# Patient Record
Sex: Male | Born: 1970 | Race: White | Hispanic: No | Smoking: Former smoker
Health system: Southern US, Community
[De-identification: ages and names within clinical notes are randomized; demographics above are authoritative.]

## PROBLEM LIST (undated history)

## (undated) DIAGNOSIS — K219 Gastro-esophageal reflux disease without esophagitis: Secondary | ICD-10-CM

## (undated) DIAGNOSIS — F329 Major depressive disorder, single episode, unspecified: Secondary | ICD-10-CM

## (undated) DIAGNOSIS — N401 Enlarged prostate with lower urinary tract symptoms: Secondary | ICD-10-CM

## (undated) DIAGNOSIS — Z86010 Personal history of colonic polyps: Secondary | ICD-10-CM

## (undated) DIAGNOSIS — F32A Depression, unspecified: Secondary | ICD-10-CM

## (undated) DIAGNOSIS — Z87898 Personal history of other specified conditions: Secondary | ICD-10-CM

## (undated) DIAGNOSIS — K649 Unspecified hemorrhoids: Secondary | ICD-10-CM

## (undated) DIAGNOSIS — R339 Retention of urine, unspecified: Secondary | ICD-10-CM

## (undated) DIAGNOSIS — F419 Anxiety disorder, unspecified: Secondary | ICD-10-CM

## (undated) DIAGNOSIS — Z860101 Personal history of adenomatous and serrated colon polyps: Secondary | ICD-10-CM

## (undated) DIAGNOSIS — F411 Generalized anxiety disorder: Secondary | ICD-10-CM

## (undated) HISTORY — DX: Major depressive disorder, single episode, unspecified: F32.9

## (undated) HISTORY — PX: COLONOSCOPY: SHX174

## (undated) HISTORY — DX: Gastro-esophageal reflux disease without esophagitis: K21.9

## (undated) HISTORY — PX: CARPAL TUNNEL RELEASE: SHX101

## (undated) HISTORY — DX: Anxiety disorder, unspecified: F41.9

## (undated) HISTORY — DX: Depression, unspecified: F32.A

## (undated) HISTORY — PX: TONSILLECTOMY AND ADENOIDECTOMY: SHX28

---

## 2000-05-27 HISTORY — PX: CARPAL TUNNEL RELEASE: SHX101

## 2018-09-16 NOTE — Progress Notes (Signed)
Virtual Visit via Telephone Note  I connected with Tyron Russell on 09/17/18 at 11:15 AM EDT by telephone and verified that I am speaking with the correct person using two identifiers.   I discussed the limitations, risks, security and privacy concerns of performing an evaluation and management service by telephone and the availability of in person appointments. The staff discussed with the patient that there may be a patient responsible charge related to this service. The patient expressed understanding and agreed to proceed.  Location of Patient- Home Location of Provider- In Clinic   History of Present Illness: Mr. Aloia calls in today to establish as a new.  He is pleasant 48 year old male. He recently moved from [place] and needs local providers.  He reports having Psychiatry telemedicine appt over the weekend- great! He was initially dx'd with Depression/Anxiety in early teens. 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 He is newly engaged and moved her to be with his fiance  Since gyms are closed due to COVID-19 pandemic, he is not exercising He reports "pretty healthy eating" His BP was taken on wrist cuff this morning- advised to purchase cuff that fastens around upper arm His mother has HTN His father suffered at MI at  age 49- underwent PCI with stents and CABG He has never been on anti-hypertensives or statins He reports his labs from his CPE last year "were fine"  Patient Care Team    Relationship Specialty Notifications Start End  , Berna Spare, NP PCP - General Family Medicine  09/17/18     There are no active problems to display for this patient.    Past Medical History:  Diagnosis Date  . Anxiety   . Depression      Past Surgical History:  Procedure Laterality Date  . CARPAL TUNNEL RELEASE Right      Family History  Problem Relation Age of Onset  . Hypertension Mother   . Anxiety disorder Mother   . Depression Mother   . Heart attack Father   . Depression Maternal Grandmother   . Anxiety disorder Maternal Grandmother   . Hypertension Maternal Grandmother   . Alcohol abuse Maternal Grandfather      Social History   Substance and Sexual Activity  Drug Use Yes  . Frequency: 1.0 times per week  . Types: Marijuana     Social History   Substance and Sexual Activity  Alcohol Use Yes   Comment: occassionally     Social History   Tobacco Use  Smoking Status Former Smoker  . Packs/day: 0.50  . Years: 4.00  . Pack years: 2.00  . Types: Cigarettes  . Last attempt to quit: 05/28/1995  . Years since quitting: 23.3  Smokeless Tobacco Never Used     Outpatient Encounter Medications as of 09/17/2018  Medication Sig  . clonazePAM (KLONOPIN) 0.5 MG tablet Take  0.5 mg by mouth 2 (two) times daily as needed for anxiety.  Marland Kitchen FLUoxetine (PROZAC) 40 MG capsule Take 40 mg by mouth daily.  . risperiDONE (RISPERDAL) 2 MG tablet Take 2 mg by mouth at bedtime.   No facility-administered encounter medications on file as of 09/17/2018.     Allergies: Patient has no known allergies.  Body mass index is 34.59 kg/m.  Blood pressure (!) 170/118, pulse 89, temperature (!) 95.5 F (35.3 C), temperature source Oral, height 5\' 11"  (1.803 m), weight 248 lb (112.5 kg).     Observations/Objective: No acute distress noted during telephone conversation  Assessment and Plan: Procure upper arm BP cuff - check BP/HR daily- f/u one week Keep Psychiatry appt this weekend- they will manage mental health disorders and provide Rx Increase daily exercise Stop marijuana use   Follow Up Instructions: 1 week BP/HR CPE 3 months, fasting labs the week prior    I discussed the assessment and treatment plan with the patient. The patient was provided an opportunity to ask questions and all were answered. The patient agreed with the plan and demonstrated an understanding of the instructions.   The patient was advised to call back or seek an in-person evaluation if the symptoms worsen or if the condition fails to improve as anticipated.  I provided 30 minutes of non-face-to-face time during this encounter.   Esaw Grandchild, NP

## 2018-09-17 ENCOUNTER — Ambulatory Visit (INDEPENDENT_AMBULATORY_CARE_PROVIDER_SITE_OTHER): Payer: Medicare Other | Admitting: Adult Health

## 2018-09-17 ENCOUNTER — Encounter: Payer: Self-pay | Admitting: Adult Health

## 2018-09-17 ENCOUNTER — Other Ambulatory Visit: Payer: Self-pay

## 2018-09-17 VITALS — BP 170/118 | HR 89 | Temp 95.5°F | Ht 71.0 in | Wt 248.0 lb

## 2018-09-17 DIAGNOSIS — F32A Depression, unspecified: Secondary | ICD-10-CM

## 2018-09-17 DIAGNOSIS — Z0000 Encounter for general adult medical examination without abnormal findings: Secondary | ICD-10-CM | POA: Insufficient documentation

## 2018-09-17 DIAGNOSIS — F329 Major depressive disorder, single episode, unspecified: Secondary | ICD-10-CM | POA: Diagnosis not present

## 2018-09-17 DIAGNOSIS — F419 Anxiety disorder, unspecified: Secondary | ICD-10-CM

## 2018-09-17 NOTE — Assessment & Plan Note (Signed)
He was initially dx'd with Depression/Anxiety in early teens. He is currently taking Fluoxetine 40 mg QD, Risperidone 2mg  QD and Clonazepam 0.5mg  BID- he denies sedation or other SE He denies SI/HI He reports this medication regime for years He reports typically checking in with Behavioral Health biweekly He was placed on permanently disability 2018 for anxiety/panic attacks He previously worked as Designer, television/film set

## 2018-09-17 NOTE — Assessment & Plan Note (Signed)
Assessment and Plan: Procure upper arm BP cuff - check BP/HR daily- f/u one week Keep Psychiatry appt this weekend- they will manage mental health disorders and provide Rx Increase daily exercise Stop marijuana use   Follow Up Instructions: 1 week BP/HR CPE 3 months, fasting labs the week prior    I discussed the assessment and treatment plan with the patient. The patient was provided an opportunity to ask questions and all were answered. The patient agreed with the plan and demonstrated an understanding of the instructions.   The patient was advised to call back or seek an in-person evaluation if the symptoms worsen or if the condition fails to improve as anticipated.

## 2018-09-21 NOTE — Progress Notes (Addendum)
Virtual Visit via Telephone Note  I connected with Eric Hopkins on 09/23/18 at 10:30 AM EDT by telephone and verified that I am speaking with the correct person using two identifiers.   I discussed the limitations, risks, security and privacy concerns of performing an evaluation and management service by telephone and the availability of in person appointments.The staff discussed with the patient that there may be a patient responsible charge related to this service. The patient expressed understanding and agreed to proceed.  Location of Patient- Home Location of Provider- In Clinic   History of Present Illness: 09/17/2018 OV: Eric Hopkins calls in today to establish as a new.  He is pleasant 48 year old male. He recently moved from [place] and needs local providers.  He reports having Psychiatry telemedicine appt over the weekend- great! He was initially dx'd with Depression/Anxiety in early teens. He is currently taking Fluoxetine 40 mg QD, Risperidone 2mg  QD and Clonazepam 0.5mg  BID- he denies sedation or other SE He denies SI/HI He reports this medication regime for years He reports typically checking in with Behavioral Health biweekly He was placed on permanently disability 2018 for anxiety/panic attacks He previously worked as Designer, television/film set  He denies ETOH use He reports nightly marijuana use for insomnia/anxiety Advised him not to use marijuana, instead guided meditation, nightly stretching for stress relief and improve sleep He reports his wife committed suicide 20115, he has 40 year old son- still living in [place] and doing well (works in Architect and has strong local support system). He is newly engaged and moved her to be with his fiance  Since gyms are closed due to COVID-19 pandemic, he is not exercising He reports "pretty healthy eating" His BP was taken on wrist cuff this morning- advised to purchase cuff that fastens around upper arm His mother has HTN His father  suffered at MI at age 76- underwent PCI with stents and CABG He has never been on anti-hypertensives or statins He reports his labs from his CPE last year "were fine" 09/23/2018 OV: Eric Hopkins calls in today with home BP/HR readings: SBP 120-150s, means upper 120s DBP 80-100, mean mid 80s HR 70-80s Readings taken mostly in late afternoon, early evening  He denies CP/dyspnea/dizziness/HA/change in vision These readings have been taken from new cuff/around bicep He has been reducing "junk food", increasing fruits/vegetables and "never adds salt to my food". He has been walking and "playing with my dogs" 4-5 times/day- gym remains closed due to COVID-19 pandemic  He had initial appt with psychologist over weekend, will continue therapy bi-weekly.  Psychologist will refer him to a Nature conservation officer for med management He continues to abstain from ETOH, but continues to use marijuana nightly- again dicussed the potential negative interactions of marijuana and his rx's and encouraged him to stop use   Review of Systems: General:   No F/C, wt loss Pulm:   No DIB, SOB, pleuritic chest pain Card:  No CP, palpitations Abd:  No n/v/d or pain Ext:  No inc edema from baseline This patient does not have sx concerning for COVID-19 Infection (ie; fever, chills, cough, new or worsening shortness of breath).   Patient Care Team    Relationship Specialty Notifications Start End  Esaw Grandchild, NP PCP - General Family Medicine  09/17/18     Patient Active Problem List   Diagnosis Date Noted  . Anxiety and depression 09/17/2018  . Healthcare maintenance 09/17/2018     Past Medical History:  Diagnosis  Date  . Anxiety   . Depression      Past Surgical History:  Procedure Laterality Date  . CARPAL TUNNEL RELEASE Right      Family History  Problem Relation Age of Onset  . Hypertension Mother   . Anxiety disorder Mother   . Depression Mother   . Heart attack Father   . Depression  Maternal Grandmother   . Anxiety disorder Maternal Grandmother   . Hypertension Maternal Grandmother   . Alcohol abuse Maternal Grandfather      Social History   Substance and Sexual Activity  Drug Use Yes  . Frequency: 1.0 times per week  . Types: Marijuana     Social History   Substance and Sexual Activity  Alcohol Use Yes   Comment: occassionally     Social History   Tobacco Use  Smoking Status Former Smoker  . Packs/day: 0.50  . Years: 4.00  . Pack years: 2.00  . Types: Cigarettes  . Last attempt to quit: 05/28/1995  . Years since quitting: 23.3  Smokeless Tobacco Never Used     Outpatient Encounter Medications as of 09/23/2018  Medication Sig  . clonazePAM (KLONOPIN) 0.5 MG tablet Take 0.5 mg by mouth 2 (two) times daily as needed for anxiety.  Marland Kitchen FLUoxetine (PROZAC) 40 MG capsule Take 40 mg by mouth daily.  . risperiDONE (RISPERDAL) 2 MG tablet Take 2 mg by mouth at bedtime.   No facility-administered encounter medications on file as of 09/23/2018.     Allergies: Patient has no known allergies.  There is no height or weight on file to calculate BMI.  Blood pressure 123/86, pulse 84.  Observations/Objective: No acute distress noted during telephone conversation  Assessment and Plan: Continue all medications as directed. Remain well hydrated and follow Mediterranean Diet. Remain as active as possible. Continue with close f/u with psychology and ask for referral to local psychiatrist for med management. Continue to check BP/HR several times week at different times of the day. If you have 4 readings consistently >140/90, then call clinic Come fasting to CPE  OV in July  Follow Up Instructions: CPE with Fasting labs in July 2020   I discussed the assessment and treatment plan with the patient. The patient was provided an opportunity to ask questions and all were answered. The patient agreed with the plan and demonstrated an understanding of the  instructions.   The patient was advised to call back or seek an in-person evaluation if the symptoms worsen or if the condition fails to improve as anticipated.  I provided 15 minutes of non-face-to-face time during this encounter.   Esaw Grandchild, NP ;

## 2018-09-23 ENCOUNTER — Other Ambulatory Visit: Payer: Self-pay

## 2018-09-23 ENCOUNTER — Ambulatory Visit (INDEPENDENT_AMBULATORY_CARE_PROVIDER_SITE_OTHER): Payer: Medicare Other | Admitting: Adult Health

## 2018-09-23 ENCOUNTER — Encounter: Payer: Self-pay | Admitting: Adult Health

## 2018-09-23 VITALS — BP 123/86 | HR 84

## 2018-09-23 DIAGNOSIS — F419 Anxiety disorder, unspecified: Secondary | ICD-10-CM

## 2018-09-23 DIAGNOSIS — F329 Major depressive disorder, single episode, unspecified: Secondary | ICD-10-CM | POA: Diagnosis not present

## 2018-09-23 DIAGNOSIS — Z0000 Encounter for general adult medical examination without abnormal findings: Secondary | ICD-10-CM | POA: Diagnosis not present

## 2018-09-23 DIAGNOSIS — F32A Depression, unspecified: Secondary | ICD-10-CM

## 2018-09-23 NOTE — Assessment & Plan Note (Signed)
Continue with psychology as directed- biweekly therapy Request referral to local psychiatrist at next appt with psychologist. Pt reports stable mood

## 2018-09-23 NOTE — Assessment & Plan Note (Signed)
Assessment and Plan: Continue all medications as directed. Remain well hydrated and follow Mediterranean Diet. Remain as active as possible. Continue with close f/u with psychology and ask for referral to local psychiatrist for med management. Continue to check BP/HR several times week at different times of the day. If you have 4 readings consistently >140/90, then call clinic Come fasting to CPE  OV in July  Follow Up Instructions: CPE with Fasting labs in July 2020   I discussed the assessment and treatment plan with the patient. The patient was provided an opportunity to ask questions and all were answered. The patient agreed with the plan and demonstrated an understanding of the instructions.   The patient was advised to call back or seek an in-person evaluation if the symptoms worsen or if the condition fails to improve as anticipated.

## 2018-11-03 ENCOUNTER — Telehealth: Payer: Self-pay | Admitting: Adult Health

## 2018-11-03 NOTE — Telephone Encounter (Signed)
Patient called states Medicaid needs Dr. Hershal Coria NPI #---

## 2018-11-04 NOTE — Telephone Encounter (Signed)
LVM for pt with Dr. Hershal Coria NPI.  Charyl Bigger, CMA

## 2018-12-23 ENCOUNTER — Encounter: Payer: Medicare Other | Admitting: Adult Health

## 2019-02-18 ENCOUNTER — Other Ambulatory Visit: Payer: Medicare Other

## 2019-02-18 ENCOUNTER — Other Ambulatory Visit: Payer: Self-pay

## 2019-02-18 DIAGNOSIS — Z0000 Encounter for general adult medical examination without abnormal findings: Secondary | ICD-10-CM

## 2019-02-19 LAB — COMPREHENSIVE METABOLIC PANEL
ALT: 23 IU/L (ref 0–44)
AST: 20 IU/L (ref 0–40)
Albumin/Globulin Ratio: 1.9 (ref 1.2–2.2)
Albumin: 4.7 g/dL (ref 4.0–5.0)
Alkaline Phosphatase: 75 IU/L (ref 39–117)
BUN/Creatinine Ratio: 9 (ref 9–20)
BUN: 10 mg/dL (ref 6–24)
Bilirubin Total: 0.4 mg/dL (ref 0.0–1.2)
CO2: 24 mmol/L (ref 20–29)
Calcium: 9.3 mg/dL (ref 8.7–10.2)
Chloride: 101 mmol/L (ref 96–106)
Creatinine, Ser: 1.11 mg/dL (ref 0.76–1.27)
GFR calc Af Amer: 91 mL/min/{1.73_m2} (ref >59)
GFR calc non Af Amer: 79 mL/min/{1.73_m2} (ref >59)
Globulin, Total: 2.5 g/dL (ref 1.5–4.5)
Glucose: 103 mg/dL — ABNORMAL HIGH (ref 65–99)
Potassium: 4.4 mmol/L (ref 3.5–5.2)
Sodium: 137 mmol/L (ref 134–144)
Total Protein: 7.2 g/dL (ref 6.0–8.5)

## 2019-02-19 LAB — LIPID PANEL
Chol/HDL Ratio: 6.2 ratio — ABNORMAL HIGH (ref 0.0–5.0)
Cholesterol, Total: 174 mg/dL (ref 100–199)
HDL: 28 mg/dL — ABNORMAL LOW (ref >39)
LDL Chol Calc (NIH): 95 mg/dL (ref 0–99)
Triglycerides: 303 mg/dL — ABNORMAL HIGH (ref 0–149)
VLDL Cholesterol Cal: 51 mg/dL — ABNORMAL HIGH (ref 5–40)

## 2019-02-19 LAB — TSH: TSH: 3.41 u[IU]/mL (ref 0.450–4.500)

## 2019-02-25 ENCOUNTER — Ambulatory Visit (INDEPENDENT_AMBULATORY_CARE_PROVIDER_SITE_OTHER): Payer: Medicare Other | Admitting: Adult Health

## 2019-02-25 ENCOUNTER — Other Ambulatory Visit: Payer: Self-pay

## 2019-02-25 ENCOUNTER — Encounter: Payer: Self-pay | Admitting: Adult Health

## 2019-02-25 VITALS — BP 130/82 | HR 101 | Temp 98.5°F | Ht 68.5 in | Wt 241.2 lb

## 2019-02-25 DIAGNOSIS — Z0000 Encounter for general adult medical examination without abnormal findings: Secondary | ICD-10-CM

## 2019-02-25 DIAGNOSIS — Z23 Encounter for immunization: Secondary | ICD-10-CM

## 2019-02-25 DIAGNOSIS — E781 Pure hyperglyceridemia: Secondary | ICD-10-CM | POA: Diagnosis not present

## 2019-02-25 NOTE — Assessment & Plan Note (Signed)
The 10-year ASCVD risk score Mikey Bussing DC Brooke Bonito., et al., 2013) is: 4.1%   Values used to calculate the score:     Age: 48 years     Sex: Male     Is Non-Hispanic African American: No     Diabetic: No     Tobacco smoker: No     Systolic Blood Pressure: [id number]     Is BP treated: No     HDL Cholesterol: 28 mg/dL     Total Cholesterol: 174 mg/dL  LDL- 95 Lifestyle encouraged Recheck in 4 months

## 2019-02-25 NOTE — Assessment & Plan Note (Signed)
Remain well hydrated with water.   Follow Heart Healthy diet, focus on reducing your sugar/carbohydrate intake. Increase regular exercise.  Recommend at least 30 minutes daily, 5 days per week of walking, biking, swimming, YouTube/Pinterest workout videos. Triglycerides are elevated and HDL (good) cholesterol is low- again focus on diet/exercise to normalize. Please schedule follow- up in 4 months and come fasting to re-check labs (CMP and Lipid panel). Continue close follow-up with mental health care team. Continue to social distance and wear a mask when in public.

## 2019-02-25 NOTE — Progress Notes (Signed)
Subjective:    Patient ID: Eric Hopkins, male    DOB: April 08, 1971, 48 y.o.   MRN: [id number]  HPI:09/17/2018 OV: Eric Hopkins calls in today to establish as a new. He is pleasant 48 year old male. He recently moved from [place] and needs local providers. He reports having Psychiatry telemedicine appt over the weekend- great! He was initially dx'd with Depression/Anxiety in early teens. He is currently taking Fluoxetine 40 mg QD, Risperidone 2mg  QD and Clonazepam 0.5mg  BID- he denies sedation or other SE He denies SI/HI He reports this medication regime for years He reports typically checking in with Behavioral Health biweekly He was placed on permanently disability 2018 for anxiety/panic attacks He previously worked as Designer, television/film set  He denies ETOH use He reports nightly marijuana use for insomnia/anxiety Advised him not to use marijuana, instead guided meditation, nightly stretching for stress relief and improve sleep He reports his wife committed suicide [id number], he has 81 year old son- still living in [place] and doing well (works in Architect and has strong local support system). He is newly engaged and moved her to be with his fiance  Since gyms are closed due to COVID-19 pandemic, he is not exercising He reports "pretty healthy eating" His BP was taken on wrist cuff this morning- advised to purchase cuff that fastens around upper arm His mother has HTN His father suffered at MI at age 58- underwent PCI with stents and CABG He has never been on anti-hypertensives or statins He reports his labs from his CPE last year "were fine" 09/23/2018 OV: Eric Hopkins calls in today with home BP/HR readings: SBP 120-150s, means upper 120s DBP 80-100, mean mid 80s HR 70-80s Readings taken mostly in late afternoon, early evening  He denies CP/dyspnea/dizziness/HA/change in vision These readings have been taken from new cuff/around bicep He has been reducing "junk food", increasing  fruits/vegetables and "never adds salt to my food". He has been walking and "playing with my dogs" 4-5 times/day- gym remains closed due to COVID-19 pandemic  He had initial appt with psychologist over weekend, will continue therapy bi-weekly.  Psychologist will refer him to a local Psychiatrist for med management He continues to abstain from ETOH, but continues to use marijuana nightly- again dicussed the potential negative interactions of marijuana and his rx's and encouraged him to stop use  02/25/2019 OV:  Eric Hopkins is here for CPE He has been "running around with the dogs" and "tryinig to drum as much as I can".  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 He has lost 7 lbs in 5 months. Current weight 241 Body mass index is 36.14 kg/m. He has psychology appt's every 4 weeks, psychiatry appt's every 3 months He reports stable mood, denies SI/HI He has only used one dose of Clonazepam 0.5mg  in last month He reports smoking marijuana at least twice weekly, again encouraged him to stop.  His insurance will not cover A1c and CBC  02/18/2019 Labs: CMP-stable, slight elevation in BG  TSH-WNL, 3.410  The 10-year ASCVD risk score Eric Hopkins DC Jr., et al., 2013) is: 4.1%   Values used to calculate the score:     Age: 69 years     Sex: Male     Is Non-Hispanic African American: No     Diabetic: No     Tobacco smoker: No     Systolic Blood Pressure: [id number] mmHg     Is BP treated: No  HDL Cholesterol: 28 mg/dL     Total Cholesterol: 174 mg/dL LDL-95   Healthcare Maintenance: Immunizations:  Patient Care Team    Relationship Specialty Notifications Start End  Eric Grandchild, NP PCP - General Family Medicine  09/17/18     Patient Active Problem List   Diagnosis Date Noted  . Hypertriglyceridemia 02/25/2019  . Anxiety and depression 09/17/2018  . Healthcare maintenance 09/17/2018     Past Medical History:  Diagnosis Date  . Anxiety   . Depression       Past Surgical History:  Procedure Laterality Date  . CARPAL TUNNEL RELEASE Right      Family History  Problem Relation Age of Onset  . Hypertension Mother   . Anxiety disorder Mother   . Depression Mother   . Heart attack Father   . Depression Maternal Grandmother   . Anxiety disorder Maternal Grandmother   . Hypertension Maternal Grandmother   . Alcohol abuse Maternal Grandfather      Social History   Substance and Sexual Activity  Drug Use Yes  . Frequency: 1.0 times per week  . Types: Marijuana     Social History   Substance and Sexual Activity  Alcohol Use Yes   Comment: occassionally     Social History   Tobacco Use  Smoking Status Former Smoker  . Packs/day: 0.50  . Years: 4.00  . Pack years: 2.00  . Types: Cigarettes  . Quit date: 05/28/1995  . Years since quitting: 23.7  Smokeless Tobacco Never Used     Outpatient Encounter Medications as of 02/25/2019  Medication Sig  . clonazePAM (KLONOPIN) 0.5 MG tablet Take 0.5 mg by mouth 2 (two) times daily as needed for anxiety.  Marland Kitchen FLUoxetine (PROZAC) 40 MG capsule Take 40 mg by mouth daily.  . risperiDONE (RISPERDAL) 2 MG tablet Take 2 mg by mouth at bedtime.   No facility-administered encounter medications on file as of 02/25/2019.     Allergies: Patient has no known allergies.  Body mass index is 36.14 kg/m.  Blood pressure 130/82, pulse (!) 101, temperature 98.5 F (36.9 C), temperature source Oral, height 5' 8.5" (1.74 m), weight 241 lb 3.2 oz (109.4 kg), SpO2 96 %.  Review of Systems  Constitutional: Positive for fatigue. Negative for activity change, appetite change, chills, diaphoresis, fever and unexpected weight change.  HENT: Negative for congestion.   Eyes: Negative for visual disturbance.  Respiratory: Negative for cough, chest tightness, shortness of breath, wheezing and stridor.   Cardiovascular: Negative for chest pain, palpitations and leg swelling.  Gastrointestinal:  Negative for abdominal distention, anal bleeding, blood in stool, constipation, diarrhea, nausea and vomiting.  Endocrine: Negative for cold intolerance, heat intolerance, polydipsia, polyphagia and polyuria.  Genitourinary: Negative for decreased urine volume, difficulty urinating, flank pain, genital sores, hematuria and urgency.  Musculoskeletal: Negative for arthralgias, back pain, gait problem, joint swelling, myalgias, neck pain and neck stiffness.       R shoulder crepitus   Skin: Negative for color change, pallor, rash and wound.  Neurological: Negative for dizziness and headaches.  Hematological: Negative for adenopathy. Does not bruise/bleed easily.  Psychiatric/Behavioral: Negative for agitation, behavioral problems, confusion, decreased concentration, dysphoric mood, hallucinations, self-injury, sleep disturbance and suicidal ideas. The patient is not nervous/anxious and is not hyperactive.        Objective:   Physical Exam Vitals signs and nursing note reviewed.  Constitutional:      General: He is not in acute distress.    Appearance:  He is obese. He is not ill-appearing, toxic-appearing or diaphoretic.  HENT:     Head: Normocephalic and atraumatic.     Right Ear: Tympanic membrane, ear canal and external ear normal. There is no impacted cerumen.     Left Ear: Tympanic membrane, ear canal and external ear normal. There is no impacted cerumen.     Nose: Nose normal. No congestion.     Mouth/Throat:     Mouth: Mucous membranes are moist.     Pharynx: No oropharyngeal exudate.  Eyes:     Extraocular Movements: Extraocular movements intact.     Conjunctiva/sclera: Conjunctivae normal.     Pupils: Pupils are equal, round, and reactive to light.  Neck:     Musculoskeletal: Normal range of motion and neck supple.  Cardiovascular:     Rate and Rhythm: Normal rate and regular rhythm.     Pulses: Normal pulses.     Heart sounds: Normal heart sounds. No murmur. No friction rub.  No gallop.   Pulmonary:     Effort: Pulmonary effort is normal. No respiratory distress.     Breath sounds: Normal breath sounds. No stridor. No wheezing, rhonchi or rales.  Chest:     Chest wall: No tenderness.  Abdominal:     General: Abdomen is protuberant. Bowel sounds are normal.     Palpations: Abdomen is soft.     Tenderness: There is no abdominal tenderness. There is no right CVA tenderness or left CVA tenderness.     Hernia: No hernia is present.  Genitourinary:    Comments: Declined DRE/genital exam  Musculoskeletal:     Right shoulder: He exhibits crepitus. He exhibits normal range of motion and no tenderness.     Left shoulder: Normal.     Right elbow: Normal. Skin:    General: Skin is warm.     Capillary Refill: Capillary refill takes less than 2 seconds.  Neurological:     Mental Status: He is alert and oriented to person, place, and time.     Coordination: Coordination normal.  Psychiatric:        Attention and Perception: Attention and perception normal.        Mood and Affect: Affect is flat.        Speech: Speech normal.        Behavior: Behavior normal.        Thought Content: Thought content normal.        Cognition and Memory: Cognition and memory normal.        Judgment: Judgment normal.       Assessment & Plan:   1. Need for influenza vaccination   2. Need for Tdap vaccination   3. Healthcare maintenance   4. Hypertriglyceridemia     Healthcare maintenance Remain well hydrated with water.   Follow Heart Healthy diet, focus on reducing your sugar/carbohydrate intake. Increase regular exercise.  Recommend at least 30 minutes daily, 5 days per week of walking, biking, swimming, YouTube/Pinterest workout videos. Triglycerides are elevated and HDL (good) cholesterol is low- again focus on diet/exercise to normalize. Please schedule follow- up in 4 months and come fasting to re-check labs (CMP and Lipid panel). Continue close follow-up with mental  health care team. Continue to social distance and wear a mask when in public.  Hypertriglyceridemia The 10-year ASCVD risk score Eric Hopkins DC Jr., et al., 2013) is: 4.1%   Values used to calculate the score:     Age: 53 years  Sex: Male     Is Non-Hispanic African American: No     Diabetic: No     Tobacco smoker: No     Systolic Blood Pressure: [id number] mmHg     Is BP treated: No     HDL Cholesterol: 28 mg/dL     Total Cholesterol: 174 mg/dL  LDL- 95 Lifestyle encouraged Recheck in 4 months     FOLLOW-UP:  Return in about 4 months (around 06/28/2019) for Regular Follow Up, Obesity, Fasting Labs.

## 2019-02-25 NOTE — Patient Instructions (Addendum)

## 2019-05-24 ENCOUNTER — Encounter: Payer: Self-pay | Admitting: Adult Health

## 2019-06-24 NOTE — Progress Notes (Signed)
Subjective:    Patient ID: Eric Hopkins, male    DOB: 1970/10/03, 49 y.o.   MRN: [id number]  HPI: 02/25/2019 OV:  [name] is here for CPE He has been "running around with the dogs" and "tryinig to drum as much as I can".  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 He has lost 7 lbs in 5 months. Current weight 241 Body mass index is 36.14 kg/m. He has psychology appt's every 4 weeks, psychiatry appt's every 3 months He reports stable mood, denies SI/HI He has only used one dose of Clonazepam 0.69m in last month He reports smoking marijuana at least twice weekly, again encouraged him to stop.  His insurance will not cover A1c and CBC  02/18/2019 Labs: CMP-stable, slight elevation in BG  TSH-WNL, 3.410  The 10-year ASCVD risk score ([name], et al., 2013) is: 4.1%   Values used to calculate the score:     Age: [age]years     Sex: Male     Is Non-Hispanic African American: No     Diabetic: No     Tobacco smoker: No     Systolic Blood Pressure: 1189mmHg     Is BP treated: No     HDL Cholesterol: 28 mg/dL     Total Cholesterol: 174 mg/dL LDL-95  2/1/2021OV:  Ms. MDeweyis here for regular f/u: Obesity, Hypertriglyceridemia, and Depression/Anxiety. He has lost >25 lbs since last OV by dramatically reducing CHO intake. Current wt 216 Body mass index is 32.45 kg/m.  He denies regular use of ETOH- states "I may drink a beer once month".  [facility]- monthly with therapist and every 4 months with psychiatrist. He reports stable mood, denies SI/HI- currently on Fluoxetine 431mQD, Risperidone 40m30mHS, Clonazepam 0.5mg101mN.  He will be getting married 23rd of April and moving to a new home this spring- he is very excited about these upcoming events. He remains active with "running around with the dogs"- estimates to walk >1 mile/day. He drinks 4 x 16 oz bottles water/day. He would like his A1c drawn with hhis labs  today- his insurance will not cover- per [facility] out-of-pocket cost approx $27.  He is agreeable to assuming cost.  He denies acute cardiac sx's.  Patient Care Team    Relationship Specialty Notifications Start End  DanfEsaw Grandchild PCP - General Family Medicine  09/17/18     Patient Active Problem List   Diagnosis Date Noted  . Hypertriglyceridemia 02/25/2019  . Anxiety and depression 09/17/2018  . Healthcare maintenance 09/17/2018     Past Medical History:  Diagnosis Date  . Anxiety   . Depression      Past Surgical History:  Procedure Laterality Date  . CARPAL TUNNEL RELEASE Right      Family History  Problem Relation Age of Onset  . Hypertension Mother   . Anxiety disorder Mother   . Depression Mother   . Heart attack Father   . Depression Maternal Grandmother   . Anxiety disorder Maternal Grandmother   . Hypertension Maternal Grandmother   . Alcohol abuse Maternal Grandfather      Social History   Substance and Sexual Activity  Drug Use Yes  . Frequency: 1.0 times per week  . Types: Marijuana     Social History   Substance and Sexual Activity  Alcohol Use Yes   Comment: occassionally  Social History   Tobacco Use  Smoking Status Former Smoker  . Packs/day: 0.50  . Years: 4.00  . Pack years: 2.00  . Types: Cigarettes  . Quit date: 05/28/1995  . Years since quitting: 24.1  Smokeless Tobacco Never Used     Outpatient Encounter Medications as of 06/28/2019  Medication Sig  . clonazePAM (KLONOPIN) 0.5 MG tablet Take 0.5 mg by mouth 2 (two) times daily as needed for anxiety.  Marland Kitchen FLUoxetine (PROZAC) 40 MG capsule Take 40 mg by mouth daily.  . risperiDONE (RISPERDAL) 2 MG tablet Take 2 mg by mouth at bedtime.   No facility-administered encounter medications on file as of 06/28/2019.    Allergies: Patient has no known allergies.  Body mass index is 32.45 kg/m.  Blood pressure 103/66, pulse 78, temperature 97.8 F (36.6 C),  temperature source Oral, height 5' 8.5" (1.74 m), weight 216 lb 9.6 oz (98.2 kg), SpO2 97 %.     Review of Systems  Constitutional: Positive for fatigue. Negative for activity change, appetite change, chills, diaphoresis, fever and unexpected weight change.  HENT: Negative for congestion.   Eyes: Negative for visual disturbance.  Respiratory: Negative for cough, chest tightness, shortness of breath, wheezing and stridor.   Cardiovascular: Negative for chest pain, palpitations and leg swelling.  Gastrointestinal: Negative for abdominal distention, abdominal pain, blood in stool, constipation, diarrhea, nausea and vomiting.  Endocrine: Negative for polydipsia, polyphagia and polyuria.  Genitourinary: Negative for difficulty urinating and frequency.  Musculoskeletal: Negative for arthralgias, back pain, myalgias and neck stiffness.  Neurological: Negative for dizziness and headaches.  Hematological: Negative for adenopathy. Does not bruise/bleed easily.  Psychiatric/Behavioral: Negative for agitation, behavioral problems, confusion, decreased concentration, dysphoric mood, hallucinations, self-injury, sleep disturbance and suicidal ideas. The patient is not nervous/anxious and is not hyperactive.        Objective:   Physical Exam Vitals and nursing note reviewed.  Constitutional:      General: He is not in acute distress.    Appearance: He is obese. He is not ill-appearing, toxic-appearing or diaphoretic.  HENT:     Head: Normocephalic and atraumatic.  Eyes:     Extraocular Movements: Extraocular movements intact.     Conjunctiva/sclera: Conjunctivae normal.     Pupils: Pupils are equal, round, and reactive to light.  Cardiovascular:     Rate and Rhythm: Normal rate and regular rhythm.     Pulses: Normal pulses.     Heart sounds: Normal heart sounds. No murmur. No friction rub. No gallop.   Skin:    General: Skin is warm.     Capillary Refill: Capillary refill takes less than 2  seconds.  Neurological:     Mental Status: He is alert and oriented to person, place, and time.  Psychiatric:        Attention and Perception: Attention and perception normal.        Mood and Affect: Mood normal. Affect is flat.        Speech: Speech normal.        Behavior: Behavior normal.        Thought Content: Thought content normal.        Cognition and Memory: Cognition and memory normal.        Judgment: Judgment normal.        Assessment & Plan:   1. Obesity (BMI 30.0-34.9)   2. Hypertriglyceridemia   3. Healthcare maintenance   4. Anxiety and depression     Healthcare maintenance GREAT JOB  on the weight loss and your blood pressure is excellent. Remain as active as possible and follow Mediterranean diet. Continue close follow-up with your behavioral health care team. We will contact you when lab results are available. Continue to social distance and wear a mask when in public. Basic information for mRNA COVID-19 vaccines Please note that these CDC guidelines can change from week to week, and even day to day.  Refer to the [facility] website for the most current recommendations for any given day at [email or web address]   1) Under the EUAs (emergency use authorization), the following age groups are authorized to receive the COVID-19 vaccination: . Pfizer-BioNTech: ages ?16 years . Moderna: ages ?18 years . Children and adolescents outside of these authorized age groups should not receive COVID-19 vaccination at this time   2) You are not eligible to receive a COVID-19 vaccine if: . You have received another vaccine in the last 14-days (example: flu shot, shingles, tetanus, etc.) . You currently have a positive test for COVID-19. The vaccine must be deferred until you have recovered from the acute illness and the criteria has been met for you to discontinue isolation. . You have received passive antibody therapy (monoclonal antibodies or convalescent serum)  as treatment for COVID-19 within the past 90 days   3) Currently COVID-19 vaccines are contraindicated in patients who have had an anaphylactic reaction to polyethylene glycol ( ie. GoLYTELY bowel prep or MiraLAX ) or polysorbate products (often used as a thickening agent for many ice creams, frozen custard and whipped dessert products etc).   Also, if you had an anaphylactic reaction to your first COVID-19 vaccination, you cannot receive your second.     - Also if you are currently pregnant please check with your obstetrician to see what their recommendations are regarding vaccination.   - Also, if you have a chronic condition such as CLL, RA or other immunocompromising conditions, please know that no data are currently available (or very limited data) on the safety and efficacy of mRNA COVID-19 vaccines in persons with autoimmune/ immunocompromising conditions.   Please seek the advice of your specialist regarding whether or not you should receive your COVID-19 vaccination.   Follow-up 6 months with primary care for complete physical with fasting labs. Good luck with the wedding and the move!  Hypertriglyceridemia 25 lb wt loss due to reduced CHO intake Does not use ETOH Lipids drawn today   Anxiety and depression He continues close f/u with [facility] care team- monthly with therapist and every 4 months with psychiatrist. He reports stable mood, denies SI/HI- currently on Fluoxetine 31m QD, Risperidone 265mQHS, Clonazepam 0.5m74mRN.     FOLLOW-UP:  Return in about 8 months (around 02/25/2020) for CPE, Fasting Labs.

## 2019-06-25 ENCOUNTER — Other Ambulatory Visit: Payer: Self-pay

## 2019-06-25 DIAGNOSIS — E781 Pure hyperglyceridemia: Secondary | ICD-10-CM

## 2019-06-28 ENCOUNTER — Encounter: Payer: Self-pay | Admitting: Adult Health

## 2019-06-28 ENCOUNTER — Other Ambulatory Visit: Payer: Self-pay

## 2019-06-28 ENCOUNTER — Ambulatory Visit (INDEPENDENT_AMBULATORY_CARE_PROVIDER_SITE_OTHER): Payer: Medicare Other | Admitting: Adult Health

## 2019-06-28 VITALS — BP 103/66 | HR 78 | Temp 97.8°F | Ht 68.5 in | Wt 216.6 lb

## 2019-06-28 DIAGNOSIS — F32A Depression, unspecified: Secondary | ICD-10-CM

## 2019-06-28 DIAGNOSIS — F419 Anxiety disorder, unspecified: Secondary | ICD-10-CM

## 2019-06-28 DIAGNOSIS — Z0000 Encounter for general adult medical examination without abnormal findings: Secondary | ICD-10-CM

## 2019-06-28 DIAGNOSIS — E781 Pure hyperglyceridemia: Secondary | ICD-10-CM | POA: Diagnosis not present

## 2019-06-28 DIAGNOSIS — F329 Major depressive disorder, single episode, unspecified: Secondary | ICD-10-CM

## 2019-06-28 DIAGNOSIS — E669 Obesity, unspecified: Secondary | ICD-10-CM | POA: Diagnosis not present

## 2019-06-28 NOTE — Assessment & Plan Note (Signed)
GREAT JOB on the weight loss and your blood pressure is excellent. Remain as active as possible and follow Mediterranean diet. Continue close follow-up with your behavioral health care team. We will contact you when lab results are available. Continue to social distance and wear a mask when in public. Basic information for mRNA COVID-19 vaccines Please note that these CDC guidelines can change from week to week, and even day to day.  Refer to the [facility] website for the most current recommendations for any given day at [email or web address]   1) Under the EUAs (emergency use authorization), the following age groups are authorized to receive the COVID-19 vaccination: . Pfizer-BioNTech: ages ?16 years . Moderna: ages ?18 years . Children and adolescents outside of these authorized age groups should not receive COVID-19 vaccination at this time   2) You are not eligible to receive a COVID-19 vaccine if: . You have received another vaccine in the last 14-days (example: flu shot, shingles, tetanus, etc.) . You currently have a positive test for COVID-19. The vaccine must be deferred until you have recovered from the acute illness and the criteria has been met for you to discontinue isolation. . You have received passive antibody therapy (monoclonal antibodies or convalescent serum) as treatment for COVID-19 within the past 90 days   3) Currently COVID-19 vaccines are contraindicated in patients who have had an anaphylactic reaction to polyethylene glycol ( ie. GoLYTELY bowel prep or MiraLAX ) or polysorbate products (often used as a thickening agent for many ice creams, frozen custard and whipped dessert products etc).   Also, if you had an anaphylactic reaction to your first COVID-19 vaccination, you cannot receive your second.     - Also if you are currently pregnant please check with your obstetrician to see what their recommendations are regarding vaccination.   - Also, if you  have a chronic condition such as CLL, RA or other immunocompromising conditions, please know that no data are currently available (or very limited data) on the safety and efficacy of mRNA COVID-19 vaccines in persons with autoimmune/ immunocompromising conditions.   Please seek the advice of your specialist regarding whether or not you should receive your COVID-19 vaccination.   Follow-up 6 months with primary care for complete physical with fasting labs. Good luck with the wedding and the move!

## 2019-06-28 NOTE — Assessment & Plan Note (Signed)
He continues close f/u with [facility] care team- monthly with therapist and every 4 months with psychiatrist. He reports stable mood, denies SI/HI- currently on Fluoxetine 40mg  QD, Risperidone 2mg  QHS, Clonazepam 0.5mg  PRN.

## 2019-06-28 NOTE — Assessment & Plan Note (Signed)
25 lb wt loss due to reduced CHO intake Does not use ETOH Lipids drawn today

## 2019-06-28 NOTE — Patient Instructions (Addendum)
Mediterranean Diet A Mediterranean diet refers to food and lifestyle choices that are based on the traditions of countries located on the [employer]. This way of eating has been shown to help prevent certain conditions and improve outcomes for people who have chronic diseases, like kidney disease and heart disease. What are tips for following this plan? Lifestyle  Cook and eat meals together with your family, when possible.  Drink enough fluid to keep your urine clear or pale yellow.  Be physically active every day. This includes: ? Aerobic exercise like running or swimming. ? Leisure activities like gardening, walking, or housework.  Get 7-8 hours of sleep each night.  If recommended by your health care provider, drink red wine in moderation. This means 1 glass a day for nonpregnant women and 2 glasses a day for men. A glass of wine equals 5 oz (150 mL). Reading food labels   Check the serving size of packaged foods. For foods such as rice and pasta, the serving size refers to the amount of cooked product, not dry.  Check the total fat in packaged foods. Avoid foods that have saturated fat or trans fats.  Check the ingredients list for added sugars, such as corn syrup. Shopping  At the grocery store, buy most of your food from the areas near the walls of the store. This includes: ? Fresh fruits and vegetables (produce). ? Grains, beans, nuts, and seeds. Some of these may be available in unpackaged forms or large amounts (in bulk). ? Fresh seafood. ? Poultry and eggs. ? Low-fat dairy products.  Buy whole ingredients instead of prepackaged foods.  Buy fresh fruits and vegetables in-season from local farmers markets.  Buy frozen fruits and vegetables in resealable bags.  If you do not have access to quality fresh seafood, buy precooked frozen shrimp or canned fish, such as tuna, salmon, or sardines.  Buy small amounts of raw or cooked vegetables, salads, or olives from  the deli or salad bar at your store.  Stock your pantry so you always have certain foods on hand, such as olive oil, canned tuna, canned tomatoes, rice, pasta, and beans. Cooking  Cook foods with extra-virgin olive oil instead of using butter or other vegetable oils.  Have meat as a side dish, and have vegetables or grains as your main dish. This means having meat in small portions or adding small amounts of meat to foods like pasta or stew.  Use beans or vegetables instead of meat in common dishes like chili or lasagna.  Experiment with different cooking methods. Try roasting or broiling vegetables instead of steaming or sauteing them.  Add frozen vegetables to soups, stews, pasta, or rice.  Add nuts or seeds for added healthy fat at each meal. You can add these to yogurt, salads, or vegetable dishes.  Marinate fish or vegetables using olive oil, lemon juice, garlic, and fresh herbs. Meal planning   Plan to eat 1 vegetarian meal one day each week. Try to work up to 2 vegetarian meals, if possible.  Eat seafood 2 or more times a week.  Have healthy snacks readily available, such as: ? Vegetable sticks with hummus. ? [place] yogurt. ? Fruit and nut trail mix.  Eat balanced meals throughout the week. This includes: ? Fruit: 2-3 servings a day ? Vegetables: 4-5 servings a day ? Low-fat dairy: 2 servings a day ? Fish, poultry, or lean meat: 1 serving a day ? Beans and legumes: 2 or more servings a week ?  Nuts and seeds: 1-2 servings a day ? Whole grains: 6-8 servings a day ? Extra-virgin olive oil: 3-4 servings a day  Limit red meat and sweets to only a few servings a month What are my food choices?  Mediterranean diet ? Recommended  Grains: Whole-grain pasta. Brown rice. Bulgar wheat. Polenta. Couscous. Whole-wheat bread. [place].  Vegetables: Artichokes. Beets. Broccoli. Cabbage. Carrots. Eggplant. Green beans. Chard. Kale. Spinach. Onions. Leeks. Peas. Squash.  Tomatoes. Peppers. Radishes.  Fruits: Apples. Apricots. Avocado. Berries. Bananas. Cherries. Dates. Figs. Grapes. Lemons. Melon. Oranges. Peaches. Plums. Pomegranate.  Meats and other protein foods: Beans. Almonds. Sunflower seeds. Pine nuts. Peanuts. [place]. Salmon. Scallops. Shrimp. [place]. Tilapia. Clams. Oysters. Eggs.  Dairy: Low-fat milk. Cheese. Greek yogurt.  Beverages: Water. Red wine. Herbal tea.  Fats and oils: Extra virgin olive oil. Avocado oil. Grape seed oil.  Sweets and desserts: [place] yogurt with honey. Baked apples. Poached pears. Trail mix.  Seasoning and other foods: Basil. Cilantro. Coriander. Cumin. Mint. Parsley. Sage. Rosemary. Tarragon. Garlic. Oregano. Thyme. Pepper. Balsalmic vinegar. Tahini. Hummus. Tomato sauce. Olives. Mushrooms. ? Limit these  Grains: Prepackaged pasta or rice dishes. Prepackaged cereal with added sugar.  Vegetables: Deep fried potatoes (french fries).  Fruits: Fruit canned in syrup.  Meats and other protein foods: Beef. Pork. Lamb. Poultry with skin. Hot dogs. [place] Salines.  Dairy: Ice cream. Sour cream. Whole milk.  Beverages: Juice. Sugar-sweetened soft drinks. Beer. Liquor and spirits.  Fats and oils: Butter. Canola oil. Vegetable oil. Beef fat (tallow). Lard.  Sweets and desserts: Cookies. Cakes. Pies. Candy.  Seasoning and other foods: Mayonnaise. Premade sauces and marinades. The items listed may not be a complete list. Talk with your dietitian about what dietary choices are right for you. Summary  The Mediterranean diet includes both food and lifestyle choices.  Eat a variety of fresh fruits and vegetables, beans, nuts, seeds, and whole grains.  Limit the amount of red meat and sweets that you eat.  Talk with your health care provider about whether it is safe for you to drink red wine in moderation. This means 1 glass a day for nonpregnant women and 2 glasses a day for men. A glass of wine equals 5 oz (150 mL). This information  is not intended to replace advice given to you by your health care provider. Make sure you discuss any questions you have with your health care provider. Document Revised: 01/11/2016 Document Reviewed: 01/04/2016 Elsevier Patient Education  [place] on the weight loss and your blood pressure is excellent. Remain as active as possible and follow Mediterranean diet. Continue close follow-up with your behavioral health care team. We will contact you when lab results are available. Continue to social distance and wear a mask when in public. Basic information for mRNA COVID-19 vaccines Please note that these CDC guidelines can change from week to week, and even day to day.  Refer to the [facility] website for the most current recommendations for any given day at [email or web address]   1) Under the EUAs (emergency use authorization), the following age groups are authorized to receive the COVID-19 vaccination: . Pfizer-BioNTech: ages ?16 years . Moderna: ages ?18 years . Children and adolescents outside of these authorized age groups should not receive COVID-19 vaccination at this time   2) You are not eligible to receive a COVID-19 vaccine if: . You have received another vaccine in the last 14-days (example: flu shot, shingles, tetanus, etc.) . You currently have a positive  test for COVID-19. The vaccine must be deferred until you have recovered from the acute illness and the criteria has been met for you to discontinue isolation. . You have received passive antibody therapy (monoclonal antibodies or convalescent serum) as treatment for COVID-19 within the past 90 days   3) Currently COVID-19 vaccines are contraindicated in patients who have had an anaphylactic reaction to polyethylene glycol ( ie. GoLYTELY bowel prep or MiraLAX ) or polysorbate products (often used as a thickening agent for many ice creams, frozen custard and whipped dessert products etc).    Also, if you had an anaphylactic reaction to your first COVID-19 vaccination, you cannot receive your second.     - Also if you are currently pregnant please check with your obstetrician to see what their recommendations are regarding vaccination.   - Also, if you have a chronic condition such as CLL, RA or other immunocompromising conditions, please know that no data are currently available (or very limited data) on the safety and efficacy of mRNA COVID-19 vaccines in persons with autoimmune/ immunocompromising conditions.   Please seek the advice of your specialist regarding whether or not you should receive your COVID-19 vaccination.   Follow-up 6 months with primary care for complete physical with fasting labs. Good luck with the wedding and the move! GREAT TO SEE YOU!

## 2019-06-29 ENCOUNTER — Encounter: Payer: Self-pay | Admitting: Adult Health

## 2019-06-29 LAB — COMPREHENSIVE METABOLIC PANEL
ALT: 22 IU/L (ref 0–44)
AST: 18 IU/L (ref 0–40)
Albumin/Globulin Ratio: 2 (ref 1.2–2.2)
Albumin: 4.5 g/dL (ref 4.0–5.0)
Alkaline Phosphatase: 70 IU/L (ref 39–117)
BUN/Creatinine Ratio: 13 (ref 9–20)
BUN: 14 mg/dL (ref 6–24)
Bilirubin Total: 0.3 mg/dL (ref 0.0–1.2)
CO2: 19 mmol/L — ABNORMAL LOW (ref 20–29)
Calcium: 9.3 mg/dL (ref 8.7–10.2)
Chloride: 106 mmol/L (ref 96–106)
Creatinine, Ser: 1.09 mg/dL (ref 0.76–1.27)
GFR calc Af Amer: 93 mL/min/{1.73_m2} (ref >59)
GFR calc non Af Amer: 80 mL/min/{1.73_m2} (ref >59)
Globulin, Total: 2.3 g/dL (ref 1.5–4.5)
Glucose: 100 mg/dL — ABNORMAL HIGH (ref 65–99)
Potassium: 4.2 mmol/L (ref 3.5–5.2)
Sodium: 139 mmol/L (ref 134–144)
Total Protein: 6.8 g/dL (ref 6.0–8.5)

## 2019-06-29 LAB — HEMOGLOBIN A1C
Est. average glucose Bld gHb Est-mCnc: 100 mg/dL
Hgb A1c MFr Bld: 5.1 % (ref 4.8–5.6)

## 2019-06-29 LAB — LIPID PANEL
Chol/HDL Ratio: 5.1 ratio — ABNORMAL HIGH (ref 0.0–5.0)
Cholesterol, Total: 154 mg/dL (ref 100–199)
HDL: 30 mg/dL — ABNORMAL LOW (ref >39)
LDL Chol Calc (NIH): 110 mg/dL — ABNORMAL HIGH (ref 0–99)
Triglycerides: 72 mg/dL (ref 0–149)
VLDL Cholesterol Cal: 14 mg/dL (ref 5–40)

## 2020-01-17 ENCOUNTER — Encounter: Payer: Self-pay | Admitting: Physician Assistant

## 2020-02-18 ENCOUNTER — Other Ambulatory Visit: Payer: Self-pay

## 2020-02-18 ENCOUNTER — Other Ambulatory Visit: Payer: Medicare Other

## 2020-02-18 DIAGNOSIS — E781 Pure hyperglyceridemia: Secondary | ICD-10-CM

## 2020-02-18 DIAGNOSIS — Z0000 Encounter for general adult medical examination without abnormal findings: Secondary | ICD-10-CM

## 2020-02-18 DIAGNOSIS — E669 Obesity, unspecified: Secondary | ICD-10-CM

## 2020-02-18 NOTE — Progress Notes (Signed)
Unable to perform A1C. Insurance will not cover. AS, CMA

## 2020-02-19 LAB — LIPID PANEL
Chol/HDL Ratio: 5.6 ratio — ABNORMAL HIGH (ref 0.0–5.0)
Cholesterol, Total: 200 mg/dL — ABNORMAL HIGH (ref 100–199)
HDL: 36 mg/dL — ABNORMAL LOW (ref >39)
LDL Chol Calc (NIH): 146 mg/dL — ABNORMAL HIGH (ref 0–99)
Triglycerides: 99 mg/dL (ref 0–149)
VLDL Cholesterol Cal: 18 mg/dL (ref 5–40)

## 2020-02-19 LAB — CBC
Hematocrit: 45 % (ref 37.5–51.0)
Hemoglobin: 15.1 g/dL (ref 13.0–17.7)
MCH: 29.4 pg (ref 26.6–33.0)
MCHC: 33.6 g/dL (ref 31.5–35.7)
MCV: 88 fL (ref 79–97)
Platelets: 197 10*3/uL (ref 150–450)
RBC: 5.14 x10E6/uL (ref 4.14–5.80)
RDW: 12.4 % (ref 11.6–15.4)
WBC: 7.1 10*3/uL (ref 3.4–10.8)

## 2020-02-19 LAB — COMPREHENSIVE METABOLIC PANEL
ALT: 20 IU/L (ref 0–44)
AST: 14 IU/L (ref 0–40)
Albumin/Globulin Ratio: 1.8 (ref 1.2–2.2)
Albumin: 4.8 g/dL (ref 4.0–5.0)
Alkaline Phosphatase: 65 IU/L (ref 44–121)
BUN/Creatinine Ratio: 16 (ref 9–20)
BUN: 19 mg/dL (ref 6–24)
Bilirubin Total: 0.2 mg/dL (ref 0.0–1.2)
CO2: 22 mmol/L (ref 20–29)
Calcium: 9.7 mg/dL (ref 8.7–10.2)
Chloride: 103 mmol/L (ref 96–106)
Creatinine, Ser: 1.2 mg/dL (ref 0.76–1.27)
GFR calc Af Amer: 82 mL/min/{1.73_m2} (ref >59)
GFR calc non Af Amer: 71 mL/min/{1.73_m2} (ref >59)
Globulin, Total: 2.6 g/dL (ref 1.5–4.5)
Glucose: 102 mg/dL — ABNORMAL HIGH (ref 65–99)
Potassium: 4.2 mmol/L (ref 3.5–5.2)
Sodium: 139 mmol/L (ref 134–144)
Total Protein: 7.4 g/dL (ref 6.0–8.5)

## 2020-02-19 LAB — TSH: TSH: 2.23 u[IU]/mL (ref 0.450–4.500)

## 2020-02-25 ENCOUNTER — Other Ambulatory Visit: Payer: Self-pay

## 2020-02-25 ENCOUNTER — Ambulatory Visit (INDEPENDENT_AMBULATORY_CARE_PROVIDER_SITE_OTHER): Payer: Medicare Other | Admitting: Physician Assistant

## 2020-02-25 ENCOUNTER — Encounter: Payer: Self-pay | Admitting: Physician Assistant

## 2020-02-25 VITALS — BP 108/69 | HR 88 | Ht 68.5 in | Wt 216.9 lb

## 2020-02-25 DIAGNOSIS — R350 Frequency of micturition: Secondary | ICD-10-CM

## 2020-02-25 DIAGNOSIS — Z0000 Encounter for general adult medical examination without abnormal findings: Secondary | ICD-10-CM

## 2020-02-25 DIAGNOSIS — Z125 Encounter for screening for malignant neoplasm of prostate: Secondary | ICD-10-CM | POA: Diagnosis not present

## 2020-02-25 DIAGNOSIS — Z23 Encounter for immunization: Secondary | ICD-10-CM | POA: Diagnosis not present

## 2020-02-25 DIAGNOSIS — E669 Obesity, unspecified: Secondary | ICD-10-CM

## 2020-02-25 DIAGNOSIS — Z1211 Encounter for screening for malignant neoplasm of colon: Secondary | ICD-10-CM | POA: Diagnosis not present

## 2020-02-25 DIAGNOSIS — Z6832 Body mass index (BMI) 32.0-32.9, adult: Secondary | ICD-10-CM

## 2020-02-25 NOTE — Progress Notes (Signed)
Subjective:   Eric Hopkins is a 49 y.o. male who presents for Medicare Annual/Subsequent preventive examination.  Review of Systems    General:   No F/C, unintentional wt loss Pulm:   No DIB, SOB, pleuritic chest pain Card:  No CP, palpitations Abd:  No n/v/d or pain Ext:  No inc edema from baseline   Objective:    Today's Vitals   02/25/20 0902  BP: 108/69  Pulse: 88  SpO2: 97%  Weight: 216 lb 14.4 oz (98.4 kg)  Height: 5' 8.5" (1.74 m)   Body mass index is 32.5 kg/m.  Advanced Directives 09/17/2018  Does Patient Have a Medical Advance Directive? No  Would patient like information on creating a medical advance directive? No - Patient declined    Current Medications (verified) Outpatient Encounter Medications as of 02/25/2020  Medication Sig  . clonazePAM (KLONOPIN) 0.5 MG tablet Take 0.5 mg by mouth 2 (two) times daily as needed for anxiety.  Marland Kitchen FLUoxetine (PROZAC) 40 MG capsule Take 40 mg by mouth daily.  . risperiDONE (RISPERDAL) 2 MG tablet Take 2 mg by mouth at bedtime.   No facility-administered encounter medications on file as of 02/25/2020.    Allergies (verified) Patient has no known allergies.   History: Past Medical History:  Diagnosis Date  . Anxiety   . Depression    Past Surgical History:  Procedure Laterality Date  . CARPAL TUNNEL RELEASE Right    Family History  Problem Relation Age of Onset  . Hypertension Mother   . Anxiety disorder Mother   . Depression Mother   . Heart attack Father   . Depression Maternal Grandmother   . Anxiety disorder Maternal Grandmother   . Hypertension Maternal Grandmother   . Alcohol abuse Maternal Grandfather    Social History   Socioeconomic History  . Marital status: Significant Other    Spouse name: Not on file  . Number of children: Not on file  . Years of education: Not on file  . Highest education level: Not on file  Occupational History  . Not on file  Tobacco Use  . Smoking status:  Former Smoker    Packs/day: 0.50    Years: 4.00    Pack years: 2.00    Types: Cigarettes    Quit date: 05/28/1995    Years since quitting: 24.7  . Smokeless tobacco: Never Used  Vaping Use  . Vaping Use: Never used  Substance and Sexual Activity  . Alcohol use: Yes    Comment: occassionally  . Drug use: Yes    Frequency: 1.0 times per week    Types: Marijuana  . Sexual activity: Yes    Birth control/protection: None  Other Topics Concern  . Not on file  Social History Narrative  . Not on file   Social Determinants of Health   Financial Resource Strain:   . Difficulty of Paying Living Expenses: Not on file  Food Insecurity:   . Worried About Charity fundraiser in the Last Year: Not on file  . Ran Out of Food in the Last Year: Not on file  Transportation Needs:   . Lack of Transportation (Medical): Not on file  . Lack of Transportation (Non-Medical): Not on file  Physical Activity:   . Days of Exercise per Week: Not on file  . Minutes of Exercise per Session: Not on file  Stress:   . Feeling of Stress : Not on file  Social Connections:   .  Frequency of Communication with Friends and Family: Not on file  . Frequency of Social Gatherings with Friends and Family: Not on file  . Attends Religious Services: Not on file  . Active Member of Clubs or Organizations: Not on file  . Attends Archivist Meetings: Not on file  . Marital Status: Not on file    Tobacco Counseling Counseling given: Not Answered    Diabetic?No         Activities of Daily Living In your present state of health, do you have any difficulty performing the following activities: 02/25/2020 02/25/2019  Hearing? N N  Vision? N N  Difficulty concentrating or making decisions? N Y  Walking or climbing stairs? N N  Dressing or bathing? N N  Doing errands, shopping? N N  Some recent data might be hidden    Patient Care Team: Lorrene Reid, PA-C as PCP - General (Physician  Assistant)  Indicate any recent Medical Services you may have received from other than [facility] providers in the past year (date may be approximate).     Assessment:   This is a routine wellness examination for [facility].  Hearing/Vision screen No exam data present  Dietary issues and exercise activities discussed:  -Recently started Mediterranean diet and walks. Trying to lose weight.  Goals   None    Depression Screen PHQ 2/9 Scores 02/25/2020 06/28/2019 02/25/2019 09/17/2018  PHQ - 2 Score 1 0 0 1  PHQ- 9 Score 2 0 0 3    Fall Risk Fall Risk  02/25/2020 02/25/2019  Falls in the past year? 0 0  Follow up Falls evaluation completed Falls evaluation completed    Any stairs in or around the home? Yes  If so, are there any without handrails? Yes  Home free of loose throw rugs in walkways, pet beds, electrical cords, etc? Yes  Adequate lighting in your home to reduce risk of falls? Yes   ASSISTIVE DEVICES UTILIZED TO PREVENT FALLS:  Life alert? No  Use of a cane, walker or w/c? No  Grab bars in the bathroom? No  Shower chair or bench in shower? No  Elevated toilet seat or a handicapped toilet? No   TIMED UP AND GO:  Was the test performed? Yes .  Length of time to ambulate 10 feet: 8  sec.   Gait steady and fast without use of assistive device  Cognitive Function: WNL     6CIT Screen 02/25/2020  What Year? 0 points  What month? 0 points  What time? 0 points  Count back from 20 0 points  Months in reverse 0 points  Repeat phrase 4 points  Total Score 4    Immunizations Immunization History  Administered Date(s) Administered  . Influenza,inj,Quad PF,6+ Mos 02/25/2019  . Tdap 02/25/2019    TDAP status: Up to date Flu Vaccine status: Completed at today's visit Pneumococcal vaccine status: Declined,  Education has been provided regarding the importance of this vaccine but patient still declined. Advised may receive this vaccine at local pharmacy or Health Dept. Aware  to provide a copy of the vaccination record if obtained from local pharmacy or Health Dept. Verbalized acceptance and understanding.  Covid-19 vaccine status: Completed vaccines  Qualifies for Shingles Vaccine? No   Zostavax completed No   Shingrix Completed?: No.    Education has been provided regarding the importance of this vaccine. Patient has been advised to call insurance company to determine out of pocket expense if they have not yet received  this vaccine. Advised may also receive vaccine at local pharmacy or Health Dept. Verbalized acceptance and understanding.  Screening Tests Health Maintenance  Topic Date Due  . Hepatitis C Screening  Never done  . COVID-19 Vaccine (1) Never done  . HIV Screening  Never done  . INFLUENZA VACCINE  12/26/2019  . TETANUS/TDAP  02/24/2029    Health Maintenance  Health Maintenance Due  Topic Date Due  . Hepatitis C Screening  Never done  . COVID-19 Vaccine (1) Never done  . HIV Screening  Never done  . INFLUENZA VACCINE  12/26/2019    Colorectal cancer screening: Referral to GI placed today. Pt aware the office will call re: appt.  Lung Cancer Screening: (Low Dose CT Chest recommended if Age 48-80 years, 30 pack-year currently smoking OR have quit w/in 15years.) does not qualify.   Lung Cancer Screening Referral:   Additional Screening:  Hepatitis C Screening: does not qualify; Completed patient declined  Vision Screening: Recommended annual ophthalmology exams for early detection of glaucoma and other disorders of the eye. Is the patient up to date with their annual eye exam?  Yes  not recently Who is the provider or what is the name of the office in which the patient attends annual eye exams? Unable to recall If pt is not established with a provider, would they like to be referred to a provider to establish care? No .   Dental Screening: Recommended annual dental exams for proper oral hygiene  Community Resource Referral / Chronic  Care Management: CRR required this visit?  No   CCM required this visit?  No      Plan:   -Continue to follow up with [facility] therapist and Psychiatrist. -Continue current medication regimen. -Recommend to reduce diuretics, DRE wnl (? Slight enlargement, no nodules, smooth), positive hemoccult and patient agreeable to gastroenterology referral. -Continue with weight loss efforts with dietary changes and physical activity.  -Follow up in 6 months for reg OV: HLD, Mood and repeat lipid panel  I have personally reviewed and noted the following in the patient's chart:   . Medical and social history . Use of alcohol, tobacco or illicit drugs  . Current medications and supplements . Functional ability and status . Nutritional status . Physical activity . Advanced directives . List of other physicians . Hospitalizations, surgeries, and ER visits in previous 12 months . Vitals . Screenings to include cognitive, depression, and falls . Referrals and appointments  In addition, I have reviewed and discussed with patient certain preventive protocols, quality metrics, and best practice recommendations. A written personalized care plan for preventive services as well as general preventive health recommendations were provided to patient.     Lorrene Reid, PA-C   02/25/2020

## 2020-02-25 NOTE — Patient Instructions (Addendum)
Heart-Healthy Eating Plan Heart-healthy meal planning includes:  Eating less unhealthy fats.  Eating more healthy fats.  Making other changes in your diet. Talk with your doctor or a diet specialist (dietitian) to create an eating plan that is right for you. What is my plan? Your doctor may recommend an eating plan that includes:  Total fat: ______% or less of total calories a day.  Saturated fat: ______% or less of total calories a day.  Cholesterol: less than _________mg a day. What are tips for following this plan? Cooking Avoid frying your food. Try to bake, boil, grill, or broil it instead. You can also reduce fat by:  Removing the skin from poultry.  Removing all visible fats from meats.  Steaming vegetables in water or broth. Meal planning   At meals, divide your plate into four equal parts: ? Fill one-half of your plate with vegetables and green salads. ? Fill one-fourth of your plate with whole grains. ? Fill one-fourth of your plate with lean protein foods.  Eat 4-5 servings of vegetables per day. A serving of vegetables is: ? 1 cup of raw or cooked vegetables. ? 2 cups of raw leafy greens.  Eat 4-5 servings of fruit per day. A serving of fruit is: ? 1 medium whole fruit. ?  cup of dried fruit. ?  cup of fresh, frozen, or canned fruit. ?  cup of 100% fruit juice.  Eat more foods that have soluble fiber. These are apples, broccoli, carrots, beans, peas, and barley. Try to get 20-30 g of fiber per day.  Eat 4-5 servings of nuts, legumes, and seeds per week: ? 1 serving of dried beans or legumes equals  cup after being cooked. ? 1 serving of nuts is  cup. ? 1 serving of seeds equals 1 tablespoon. General information  Eat more home-cooked food. Eat less restaurant, buffet, and fast food.  Limit or avoid alcohol.  Limit foods that are high in starch and sugar.  Avoid fried foods.  Lose weight if you are overweight.  Keep track of how much salt  (sodium) you eat. This is important if you have high blood pressure. Ask your doctor to tell you more about this.  Try to add vegetarian meals each week. Fats  Choose healthy fats. These include olive oil and canola oil, flaxseeds, walnuts, almonds, and seeds.  Eat more omega-3 fats. These include salmon, mackerel, sardines, tuna, flaxseed oil, and ground flaxseeds. Try to eat fish at least 2 times each week.  Check food labels. Avoid foods with trans fats or high amounts of saturated fat.  Limit saturated fats. ? These are often found in animal products, such as meats, butter, and cream. ? These are also found in plant foods, such as palm oil, palm kernel oil, and coconut oil.  Avoid foods with partially hydrogenated oils in them. These have trans fats. Examples are stick margarine, some tub margarines, cookies, crackers, and other baked goods. What foods can I eat? Fruits All fresh, canned (in natural juice), or frozen fruits. Vegetables Fresh or frozen vegetables (raw, steamed, roasted, or grilled). Green salads. Grains Most grains. Choose whole wheat and whole grains most of the time. Rice and pasta, including brown rice and pastas made with whole wheat. Meats and other proteins Lean, well-trimmed beef, veal, pork, and lamb. Chicken and [place] without skin. All fish and shellfish. Wild duck, rabbit, pheasant, and venison. Egg whites or low-cholesterol egg substitutes. Dried beans, peas, lentils, and tofu. Seeds and most  nuts. Dairy Low-fat or nonfat cheeses, including ricotta and mozzarella. Skim or 1% milk that is liquid, powdered, or evaporated. Buttermilk that is made with low-fat milk. Nonfat or low-fat yogurt. Fats and oils Non-hydrogenated (trans-free) margarines. Vegetable oils, including soybean, sesame, sunflower, olive, peanut, safflower, corn, canola, and cottonseed. Salad dressings or mayonnaise made with a vegetable oil. Beverages Mineral water. Coffee and tea. Diet  carbonated beverages. Sweets and desserts Sherbet, gelatin, and fruit ice. Small amounts of dark chocolate. Limit all sweets and desserts. Seasonings and condiments All seasonings and condiments. The items listed above may not be a complete list of foods and drinks you can eat. Contact a dietitian for more options. What foods should I avoid? Fruits Canned fruit in heavy syrup. Fruit in cream or butter sauce. Fried fruit. Limit coconut. Vegetables Vegetables cooked in cheese, cream, or butter sauce. Fried vegetables. Grains Breads that are made with saturated or trans fats, oils, or whole milk. Croissants. Sweet rolls. Donuts. High-fat crackers, such as cheese crackers. Meats and other proteins Fatty meats, such as hot dogs, ribs, sausage, bacon, rib-eye roast or steak. High-fat deli meats, such as salami and bologna. Caviar. Domestic duck and goose. Organ meats, such as liver. Dairy Cream, sour cream, cream cheese, and creamed cottage cheese. Whole-milk cheeses. Whole or 2% milk that is liquid, evaporated, or condensed. Whole buttermilk. Cream sauce or high-fat cheese sauce. Yogurt that is made from whole milk. Fats and oils Meat fat, or shortening. Cocoa butter, hydrogenated oils, palm oil, coconut oil, palm kernel oil. Solid fats and shortenings, including bacon fat, salt pork, lard, and butter. Nondairy cream substitutes. Salad dressings with cheese or sour cream. Beverages Regular sodas and juice drinks with added sugar. Sweets and desserts Frosting. Pudding. Cookies. Cakes. Pies. Milk chocolate or white chocolate. Buttered syrups. Full-fat ice cream or ice cream drinks. The items listed above may not be a complete list of foods and drinks to avoid. Contact a dietitian for more information. Summary  Heart-healthy meal planning includes eating less unhealthy fats, eating more healthy fats, and making other changes in your diet.  Eat a balanced diet. This includes fruits and  vegetables, low-fat or nonfat dairy, lean protein, nuts and legumes, whole grains, and heart-healthy oils and fats. This information is not intended to replace advice given to you by your health care provider. Make sure you discuss any questions you have with your health care provider. Document Revised: 07/17/2017 Document Reviewed: 06/20/2017 Elsevier Patient Education  2020 [employer]    Preventive Care 60-15 Years Old, Male Preventive care refers to lifestyle choices and visits with your health care provider that can promote health and wellness. This includes:  A yearly physical exam. This is also called an annual well check.  Regular dental and eye exams.  Immunizations.  Screening for certain conditions.  Healthy lifestyle choices, such as eating a healthy diet, getting regular exercise, not using drugs or products that contain nicotine and tobacco, and limiting alcohol use. What can I expect for my preventive care visit? Physical exam Your health care provider will check:  Height and weight. These may be used to calculate body mass index (BMI), which is a measurement that tells if you are at a healthy weight.  Heart rate and blood pressure.  Your skin for abnormal spots. Counseling Your health care provider may ask you questions about:  Alcohol, tobacco, and drug use.  Emotional well-being.  Home and relationship well-being.  Sexual activity.  Eating habits.  Work and  work environment. What immunizations do I need?  Influenza (flu) vaccine  This is recommended every year. Tetanus, diphtheria, and pertussis (Tdap) vaccine  You may need a Td booster every 10 years. Varicella (chickenpox) vaccine  You may need this vaccine if you have not already been vaccinated. Zoster (shingles) vaccine  You may need this after age 30. Measles, mumps, and rubella (MMR) vaccine  You may need at least one dose of MMR if you were born in 1957 or later. You may also need  a second dose. Pneumococcal conjugate (PCV13) vaccine  You may need this if you have certain conditions and were not previously vaccinated. Pneumococcal polysaccharide (PPSV23) vaccine  You may need one or two doses if you smoke cigarettes or if you have certain conditions. Meningococcal conjugate (MenACWY) vaccine  You may need this if you have certain conditions. Hepatitis A vaccine  You may need this if you have certain conditions or if you travel or work in places where you may be exposed to hepatitis A. Hepatitis B vaccine  You may need this if you have certain conditions or if you travel or work in places where you may be exposed to hepatitis B. Haemophilus influenzae type b (Hib) vaccine  You may need this if you have certain risk factors. Human papillomavirus (HPV) vaccine  If recommended by your health care provider, you may need three doses over 6 months. You may receive vaccines as individual doses or as more than one vaccine together in one shot (combination vaccines). Talk with your health care provider about the risks and benefits of combination vaccines. What tests do I need? Blood tests  Lipid and cholesterol levels. These may be checked every 5 years, or more frequently if you are over 54 years old.  Hepatitis C test.  Hepatitis B test. Screening  Lung cancer screening. You may have this screening every year starting at age 32 if you have a 30-pack-year history of smoking and currently smoke or have quit within the past 15 years.  Prostate cancer screening. Recommendations will vary depending on your family history and other risks.  Colorectal cancer screening. All adults should have this screening starting at age 5 and continuing until age 18. Your health care provider may recommend screening at age 72 if you are at increased risk. You will have tests every 1-10 years, depending on your results and the type of screening test.  Diabetes screening. This is done  by checking your blood sugar (glucose) after you have not eaten for a while (fasting). You may have this done every 1-3 years.  Sexually transmitted disease (STD) testing. Follow these instructions at home: Eating and drinking  Eat a diet that includes fresh fruits and vegetables, whole grains, lean protein, and low-fat dairy products.  Take vitamin and mineral supplements as recommended by your health care provider.  Do not drink alcohol if your health care provider tells you not to drink.  If you drink alcohol: ? Limit how much you have to 0-2 drinks a day. ? Be aware of how much alcohol is in your drink. In the [place] one drink equals one 12 oz bottle of beer (355 mL), one 5 oz glass of wine (148 mL), or one 1 oz glass of hard liquor (44 mL). Lifestyle  Take daily care of your teeth and gums.  Stay active. Exercise for at least 30 minutes on 5 or more days each week.  Do not use any products that contain nicotine or tobacco,  such as cigarettes, e-cigarettes, and chewing tobacco. If you need help quitting, ask your health care provider.  If you are sexually active, practice safe sex. Use a condom or other form of protection to prevent STIs (sexually transmitted infections).  Talk with your health care provider about taking a low-dose aspirin every day starting at age 44. What's next?  Go to your health care provider once a year for a well check visit.  Ask your health care provider how often you should have your eyes and teeth checked.  Stay up to date on all vaccines. This information is not intended to replace advice given to you by your health care provider. Make sure you discuss any questions you have with your health care provider. Document Revised: 05/07/2018 Document Reviewed: 05/07/2018 Elsevier Patient Education  2020 [employer].

## 2020-03-03 ENCOUNTER — Encounter: Payer: Self-pay | Admitting: Physician Assistant

## 2020-03-08 ENCOUNTER — Other Ambulatory Visit: Payer: Self-pay

## 2020-03-08 ENCOUNTER — Ambulatory Visit
Admission: EM | Admit: 2020-03-08 | Discharge: 2020-03-08 | Disposition: A | Discharge status: Home / Self Care | Payer: Medicare Other | Attending: Family Medicine | Admitting: Family Medicine

## 2020-03-08 DIAGNOSIS — K0381 Cracked tooth: Secondary | ICD-10-CM | POA: Diagnosis not present

## 2020-03-08 DIAGNOSIS — K0889 Other specified disorders of teeth and supporting structures: Secondary | ICD-10-CM | POA: Diagnosis not present

## 2020-03-08 MED ORDER — PENICILLIN V POTASSIUM 500 MG PO TABS: 500.0000 mg | ORAL_TABLET | Freq: Four times a day (QID) | ORAL | 0 refills | Status: AC

## 2020-03-08 MED ORDER — HYDROCODONE-ACETAMINOPHEN 5-325 MG PO TABS: 2.0000 | ORAL_TABLET | ORAL | 0 refills | Status: DC | PRN

## 2020-03-08 NOTE — Discharge Instructions (Addendum)
Follow-up with dentist after antibiotic completed

## 2020-03-08 NOTE — ED Triage Notes (Signed)
Pt states he cracked his tooth a couple weeks ago and started experiencing pain last night. Pt is aox4 and ambulatory.

## 2020-03-08 NOTE — ED Provider Notes (Signed)
[name]    CSN: [id number] Arrival date & time: 03/08/20  9030      History   Chief Complaint Chief Complaint  Patient presents with  . Dental Pain    since last night    HPI Eric Hopkins is a 49 y.o. male.   Patient cracked a tooth biting into an apple last week.  Pain started last night right lower incisor.  He tells me he has appointment with dentist coming up soon as his wife's insurance kicks in.  HPI  Past Medical History:  Diagnosis Date  . Anxiety   . Depression     Patient Active Problem List   Diagnosis Date Noted  . Hypertriglyceridemia 02/25/2019  . Anxiety and depression 09/17/2018  . Healthcare maintenance 09/17/2018    Past Surgical History:  Procedure Laterality Date  . CARPAL TUNNEL RELEASE Right        Home Medications    Prior to Admission medications   Medication Sig Start Date End Date Taking? Authorizing Provider  clonazePAM (KLONOPIN) 0.5 MG tablet Take 0.5 mg by mouth 2 (two) times daily as needed for anxiety.   Yes [provider]  FLUoxetine (PROZAC) 40 MG capsule Take 40 mg by mouth daily.   Yes [provider]  risperiDONE (RISPERDAL) 2 MG tablet Take 2 mg by mouth at bedtime.   Yes [provider]  HYDROcodone-acetaminophen (NORCO/VICODIN) 5-325 MG tablet Take 2 tablets by mouth every 4 (four) hours as needed. 03/08/20   Wardell Honour, MD  penicillin v potassium (VEETID) 500 MG tablet Take 1 tablet (500 mg total) by mouth 4 (four) times daily for 7 days. 03/08/20 03/15/20  Wardell Honour, MD    Family History Family History  Problem Relation Age of Onset  . Hypertension Mother   . Anxiety disorder Mother   . Depression Mother   . Heart attack Father   . Depression Maternal Grandmother   . Anxiety disorder Maternal Grandmother   . Hypertension Maternal Grandmother   . Alcohol abuse Maternal Grandfather     Social History Social History   Tobacco Use  . Smoking  status: Former Smoker    Packs/day: 0.50    Years: 4.00    Pack years: 2.00    Types: Cigarettes    Quit date: 05/28/1995    Years since quitting: 24.7  . Smokeless tobacco: Never Used  Vaping Use  . Vaping Use: Never used  Substance Use Topics  . Alcohol use: Yes    Comment: occassionally  . Drug use: Yes    Frequency: 1.0 times per week    Types: Marijuana     Allergies   Patient has no known allergies.   Review of Systems Review of Systems  HENT: Positive for dental problem.   All other systems reviewed and are negative.    Physical Exam Triage Vital Signs ED Triage Vitals  Enc Vitals Group     BP 03/08/20 1039 132/88     Pulse Rate 03/08/20 1039 87     Resp 03/08/20 1039 18     Temp 03/08/20 1039 98.4 F (36.9 C)     Temp Source 03/08/20 1039 Oral     SpO2 03/08/20 1039 99 %     Weight --      Height --      Head Circumference --      Peak Flow --      Pain Score 03/08/20 1041 6  Pain Loc --      Pain Edu? --      Excl. in [place]? --    No data found.  Updated Vital Signs BP 132/88 (BP Location: Right Arm)   Pulse 87   Temp 98.4 F (36.9 C) (Oral)   Resp 18   SpO2 99%   Visual Acuity Right Eye Distance:   Left Eye Distance:   Bilateral Distance:    Right Eye Near:   Left Eye Near:    Bilateral Near:     Physical Exam Vitals and nursing note reviewed.  Constitutional:      Appearance: Normal appearance.  HENT:     Head: Normocephalic.     Mouth/Throat:     Comments: Right lower incisor is cracked in half.  There is no significant inflammation in the gum or head adenopathy Cardiovascular:     Rate and Rhythm: Normal rate.  Pulmonary:     Effort: Pulmonary effort is normal.  Neurological:     General: No focal deficit present.     Mental Status: He is alert and oriented to person, place, and time.      UC Treatments / Results  Labs (all labs ordered are listed, but only abnormal results are displayed) Labs Reviewed - No data to  display  EKG   Radiology No results found.  Procedures Procedures (including critical care time)  Medications Ordered in UC Medications - No data to display  Initial Impression / Assessment and Plan / UC Course  I have reviewed the triage vital signs and the nursing notes.  Pertinent labs & imaging results that were available during my care of the patient were reviewed by me and considered in my medical decision making (see chart for details).    Dental pain secondary to tooth trauma Final Clinical Impressions(s) / UC Diagnoses   Final diagnoses:  Pain, dental   Discharge Instructions   None    ED Prescriptions    Medication Sig Dispense Auth. Provider   penicillin v potassium (VEETID) 500 MG tablet Take 1 tablet (500 mg total) by mouth 4 (four) times daily for 7 days. 40 tablet Wardell Honour, MD   HYDROcodone-acetaminophen (NORCO/VICODIN) 5-325 MG tablet Take 2 tablets by mouth every 4 (four) hours as needed. 10 tablet Wardell Honour, MD     I have reviewed the PDMP during this encounter.   Wardell Honour, MD 03/08/20 1055

## 2020-03-14 ENCOUNTER — Encounter: Payer: Self-pay | Admitting: Physician Assistant

## 2020-03-14 ENCOUNTER — Encounter: Payer: Self-pay | Admitting: Gastroenterology

## 2020-04-06 ENCOUNTER — Emergency Department (HOSPITAL_COMMUNITY)
Admission: EM | Admit: 2020-04-06 | Discharge: 2020-04-06 | Disposition: A | Discharge status: Home / Self Care | Payer: Medicare Other | Attending: Emergency Medicine | Admitting: Emergency Medicine

## 2020-04-06 ENCOUNTER — Encounter (HOSPITAL_COMMUNITY): Payer: Self-pay | Admitting: Emergency Medicine

## 2020-04-06 ENCOUNTER — Telehealth: Payer: Self-pay

## 2020-04-06 ENCOUNTER — Emergency Department (HOSPITAL_COMMUNITY): Payer: Medicare Other

## 2020-04-06 ENCOUNTER — Other Ambulatory Visit: Payer: Self-pay

## 2020-04-06 DIAGNOSIS — Z87891 Personal history of nicotine dependence: Secondary | ICD-10-CM | POA: Diagnosis not present

## 2020-04-06 DIAGNOSIS — R079 Chest pain, unspecified: Secondary | ICD-10-CM | POA: Diagnosis present

## 2020-04-06 LAB — CBC
HCT: 44.8 % (ref 39.0–52.0)
Hemoglobin: 14.9 g/dL (ref 13.0–17.0)
MCH: 29 pg (ref 26.0–34.0)
MCHC: 33.3 g/dL (ref 30.0–36.0)
MCV: 87.2 fL (ref 80.0–100.0)
Platelets: 155 10*3/uL (ref 150–400)
RBC: 5.14 MIL/uL (ref 4.22–5.81)
RDW: 12.4 % (ref 11.5–15.5)
WBC: 7 10*3/uL (ref 4.0–10.5)
nRBC: 0 % (ref 0.0–0.2)

## 2020-04-06 LAB — BASIC METABOLIC PANEL
Anion gap: 9 (ref 5–15)
BUN: 19 mg/dL (ref 6–20)
CO2: 22 mmol/L (ref 22–32)
Calcium: 9.4 mg/dL (ref 8.9–10.3)
Chloride: 105 mmol/L (ref 98–111)
Creatinine, Ser: 1.03 mg/dL (ref 0.61–1.24)
GFR, Estimated: >60 mL/min (ref >60)
Glucose, Bld: 119 mg/dL — ABNORMAL HIGH (ref 70–99)
Potassium: 3.9 mmol/L (ref 3.5–5.1)
Sodium: 136 mmol/L (ref 135–145)

## 2020-04-06 LAB — TROPONIN I (HIGH SENSITIVITY): Troponin I (High Sensitivity): <2 ng/L (ref <18)

## 2020-04-06 MED ORDER — OMEPRAZOLE 20 MG PO CPDR: 20.0000 mg | DELAYED_RELEASE_CAPSULE | Freq: Every day | ORAL | 0 refills | Status: DC

## 2020-04-06 NOTE — ED Notes (Signed)
Pt reports hx of bipolar d/o and panic d/o that he has taken meds for since he was 16

## 2020-04-06 NOTE — ED Triage Notes (Signed)
Pt reports for past week had intermittent central chest pains. Denies pain at this time but had it earlier before he got here. Reports family hx of heart disease so wants to be checked out. Does have hx anxiety.

## 2020-04-06 NOTE — ED Notes (Signed)
Pt reports hx of heart disease in his family. Father passed away from a MI

## 2020-04-06 NOTE — Telephone Encounter (Signed)
Pt calls stating that he has been having a sharp, stabbing pain in his chest intermittently x 1- 1 1/2 months.  Pt describes the location as mid-sternal with mild radiation to left side.  Pt denies SOB, arm or neck pain, diapheresis.  Pt states that activity does not influence onset of symptoms and that symptoms typically only last approximately 5 minutes.  Advised pt that since we do not have a provider in the office today or tomorrow, that he should be seen at ED if he is overly concerned, but pt was also given appt in our office for 04/10/2020.  Pt states that he wants to talk to his fiancee to decide how to proceed.  However, advised pt that if his symptoms worsen or he develops any red flag symptoms, he needs to call 911 and be seen at ED for evaluation.  Pt expressed understanding and is agreeable,  He will call back to let us know what he and his fiancee decide to do about an ER visit.  Charyl Bigger, CMA

## 2020-04-06 NOTE — ED Provider Notes (Signed)
[facility] DEPT Provider Note   CSN: [id number] Arrival date & time: 04/06/20  2536     History Chief Complaint  Patient presents with  . Chest Pain    Eric Hopkins is a 49 y.o. male.  HPI Patient presents with chest pains.  Has had for around 2 to 3 weeks.  Come and go.  States he feels as if it is coming on more frequently.  It is sharp pains that will come and go.  Last 10 to 15 minutes.  Is is anterior mid chest or a little to the left side.  Does not comment on exertion.  Will come on whenever he wants to.  Also will go away without him do anything.  Not associated with eating.  No fevers.  No cough.  No nausea vomiting diarrhea.  No shortness of breath.  States he does have anxiety and reported this could have something to do with that too.  States his father had his first heart attack at 64 years old.  No diaphoresis.  Does have history of hypertriglyceridemia.    Past Medical History:  Diagnosis Date  . Anxiety   . Depression     Patient Active Problem List   Diagnosis Date Noted  . Hypertriglyceridemia 02/25/2019  . Anxiety and depression 09/17/2018  . Healthcare maintenance 09/17/2018    Past Surgical History:  Procedure Laterality Date  . CARPAL TUNNEL RELEASE Right        Family History  Problem Relation Age of Onset  . Hypertension Mother   . Anxiety disorder Mother   . Depression Mother   . Heart attack Father   . Depression Maternal Grandmother   . Anxiety disorder Maternal Grandmother   . Hypertension Maternal Grandmother   . Alcohol abuse Maternal Grandfather     Social History   Tobacco Use  . Smoking status: Former Smoker    Packs/day: 0.50    Years: 4.00    Pack years: 2.00    Types: Cigarettes    Quit date: 05/28/1995    Years since quitting: 24.8  . Smokeless tobacco: Never Used  Vaping Use  . Vaping Use: Never used  Substance Use Topics  . Alcohol use: Yes    Comment: occassionally  . Drug  use: Yes    Frequency: 1.0 times per week    Types: Marijuana    Home Medications Prior to Admission medications   Medication Sig Start Date End Date Taking? Authorizing Provider  clonazePAM (KLONOPIN) 0.5 MG tablet Take 0.5 mg by mouth 2 (two) times daily as needed for anxiety.    [provider]  FLUoxetine (PROZAC) 40 MG capsule Take 40 mg by mouth daily.    [provider]  HYDROcodone-acetaminophen (NORCO/VICODIN) 5-325 MG tablet Take 2 tablets by mouth every 4 (four) hours as needed. 03/08/20   Wardell Honour, MD  omeprazole (PRILOSEC) 20 MG capsule Take 1 capsule (20 mg total) by mouth daily. 04/06/20   Davonna Belling, MD  risperiDONE (RISPERDAL) 2 MG tablet Take 2 mg by mouth at bedtime.    [provider]    Allergies    Patient has no known allergies.  Review of Systems   Review of Systems  Constitutional: Negative for appetite change.  HENT: Negative for dental problem.   Respiratory: Negative for shortness of breath.   Cardiovascular: Positive for chest pain.  Gastrointestinal: Negative for abdominal pain.  Genitourinary: Negative for flank pain.  Musculoskeletal: Negative for gait  problem.  Skin: Negative for rash.  Neurological: Negative for weakness.  Psychiatric/Behavioral: The patient is nervous/anxious.     Physical Exam Updated Vital Signs BP 116/78 (BP Location: Right Arm)   Pulse 71   Temp 98 F (36.7 C) (Oral)   Resp 11   Ht 5\' 9"  (1.753 m)   Wt 94.8 kg   SpO2 96%   BMI 30.86 kg/m   Physical Exam Vitals and nursing note reviewed.  HENT:     Head: Normocephalic.  Cardiovascular:     Rate and Rhythm: Normal rate and regular rhythm.  Pulmonary:     Breath sounds: No wheezing, rhonchi or rales.  Chest:     Chest wall: No tenderness.  Abdominal:     Tenderness: There is no abdominal tenderness.  Musculoskeletal:     Right lower leg: No tenderness. No edema.     Left lower leg: No tenderness. No edema.   Skin:    General: Skin is warm.     Capillary Refill: Capillary refill takes less than 2 seconds.  Neurological:     Mental Status: He is alert and oriented to person, place, and time.     ED Results / Procedures / Treatments   Labs (all labs ordered are listed, but only abnormal results are displayed) Labs Reviewed  BASIC METABOLIC PANEL - Abnormal; Notable for the following components:      Result Value   Glucose, Bld 119 (*)    All other components within normal limits  CBC  TROPONIN I (HIGH SENSITIVITY)  TROPONIN I (HIGH SENSITIVITY)    EKG None ED ECG REPORT   Date: 04/06/2020  Rate: 88  Rhythm: normal sinus rhythm  QRS Axis: normal  Intervals: normal  ST/T Wave abnormalities: normal  Conduction Disutrbances:none  Narrative Interpretation:   Old EKG Reviewed: none available    Radiology DG Chest 2 View  Result Date: 04/06/2020 CLINICAL DATA:  Chest pain EXAM: CHEST - 2 VIEW COMPARISON:  None. FINDINGS: Lungs are clear. Heart size and pulmonary vascularity are normal. No adenopathy. No pneumothorax. No bone lesions. IMPRESSION: Lungs clear.  Cardiac silhouette normal. Electronically Signed   By: Lowella Grip III M.D.   On: 04/06/2020 10:20    Procedures Procedures (including critical care time)  Medications Ordered in ED Medications - No data to display  ED Course  I have reviewed the triage vital signs and the nursing notes.  Pertinent labs & imaging results that were available during my care of the patient were reviewed by me and considered in my medical decision making (see chart for details).    MDM Rules/Calculators/A&P                          Patient presents with chest pain over the last 2 to 3 weeks.  Sharp.  Mid to left chest.  Not exertional.  EKG reassuring.  Troponin negative.  X-ray reassuring.  Heart pathway score of 3.  I think patient is low enough risk for outpatient follow-up.  Will have follow-up with cardiology since does have  family history of early cardiac disease.  However this does not appear to be cardiac cause.  Does not appear to be a pneumonia.  Pulmonary embolism felt less likely.  Doubt aortic dissection.  Will treat for possible GI issues.  Discharge home with outpatient follow-up.  I have reviewed imaging and EKG and blood work. Final Clinical Impression(s) / ED Diagnoses Final diagnoses:  Nonspecific chest pain    Rx / DC Orders ED Discharge Orders         Ordered    omeprazole (PRILOSEC) 20 MG capsule  Daily        04/06/20 1057           Davonna Belling, MD 04/06/20 1146

## 2020-04-10 ENCOUNTER — Ambulatory Visit: Payer: Medicare Other | Admitting: Physician Assistant

## 2020-04-14 ENCOUNTER — Other Ambulatory Visit: Payer: Self-pay

## 2020-04-14 ENCOUNTER — Ambulatory Visit (AMBULATORY_SURGERY_CENTER): Payer: Self-pay

## 2020-04-14 ENCOUNTER — Telehealth: Payer: Self-pay

## 2020-04-14 VITALS — Ht 68.5 in | Wt 217.0 lb

## 2020-04-14 DIAGNOSIS — Z1211 Encounter for screening for malignant neoplasm of colon: Secondary | ICD-10-CM

## 2020-04-14 MED ORDER — NA SULFATE-K SULFATE-MG SULF 17.5-3.13-1.6 GM/177ML PO SOLN: 1.0000 | Freq: Once | ORAL | 0 refills | Status: AC

## 2020-04-14 NOTE — Progress Notes (Signed)
No allergies to soy or egg Pt is not on blood thinners or diet pills Denies issues with sedation/intubation Denies atrial flutter/fib Denies constipation   Emmi instructions given to pt  Pt is aware of Covid safety and care partner requirements.   Pt notes he has been having chest pain, was put on Prilosec and has helped a little.    Has been referred to cardiologist on 12/10 to rule out anything heart related  Procedure for colonoscopy rescheduled until after Cardiology appt. on 1/14 at 3:00 pm.    Requested pt to have cardiologist to write in his progress note that he is cleared for colonoscopy.

## 2020-04-14 NOTE — Telephone Encounter (Signed)
  Pt states he has been having chest pain, was put on Prilosec and has helped a little.    Has been referred to cardiologist on 12/10. Appointment for colon rescheduled until after Cardiology appt just to be sure its nothing heart related.    Procedure rescheduled to  1/14 at 3:00 pm.    Requested pt to have cardiologist to write in his progress note that he is cleared for colonoscopy if it is safe to do so.

## 2020-04-28 ENCOUNTER — Encounter: Payer: Medicare Other | Admitting: Gastroenterology

## 2020-05-04 NOTE — Progress Notes (Signed)
Cardiology Office Note   Date:  05/05/2020   ID:  Eric Hopkins, DOB 03-Jun-1970, MRN [id number]  PCP:  Lorrene Reid, PA-C  Cardiologist:   Minus Breeding, MD Referring:  Lorrene Reid, PA-C  Chief Complaint  Patient presents with  . Chest Pain      History of Present Illness: Eric Hopkins is a 49 y.o. male who presents for evaluation of chest pain.  He was referred by Lorrene Reid, PA-C.  He was in the ED for this in November for this.  I reviewed these records for this visit.   He had no objective evidence of ischemia and he had negative troponin.    The patient has no past cardiac history. He has been having chest discomfort for about 3 months. It seems to be coming on with increased frequency. Happening several times per week. It is random. It is sharp. It can be 6 out of 10 in intensity. It would last for few minutes and will come and go on its own. He does not report radiation to his jaw or to his arms. He does get occasionally short of breath with it. He is not sure that it is similar to his previous reflux. Its not associated with food or position. He has not taken anything to try to get rid of it. He does not have associated nausea vomiting or diaphoresis. He does not describe PND or orthopnea. He is not having any palpitations, presyncope or syncope. He has had a 40 pound weight loss following the Mediterranean diet. He has never had any prior cardiac work-up but he does have risk factors.   Past Medical History:  Diagnosis Date  . Anxiety   . Depression   . GERD (gastroesophageal reflux disease)     Past Surgical History:  Procedure Laterality Date  . CARPAL TUNNEL RELEASE Right   . COLONOSCOPY     >10 yrs ago.  . TONSILLECTOMY AND ADENOIDECTOMY       Current Outpatient Medications  Medication Sig Dispense Refill  . clonazePAM (KLONOPIN) 0.5 MG tablet Take 0.5 mg by mouth 2 (two) times daily as needed for anxiety.    Marland Kitchen FLUoxetine (PROZAC) 40 MG  capsule Take 40 mg by mouth daily.    . risperiDONE (RISPERDAL) 2 MG tablet Take 2 mg by mouth at bedtime.     No current facility-administered medications for this visit.    Allergies:   Patient has no known allergies.    ROS:  Please see the history of present illness.   Otherwise, review of systems are positive for none.   All other systems are reviewed and negative.    PHYSICAL EXAM: VS:  BP 130/76   Pulse 73   Ht 5\' 9"  (1.753 m)   Wt 212 lb 6.4 oz (96.3 kg)   BMI 31.37 kg/m  , BMI Body mass index is 31.37 kg/m. GENERAL:  Well appearing HEENT:  Pupils equal round and reactive, fundi not visualized, oral mucosa unremarkable NECK:  No jugular venous distention, waveform within normal limits, carotid upstroke brisk and symmetric, no bruits, no thyromegaly LYMPHATICS:  No cervical, inguinal adenopathy LUNGS:  Clear to auscultation bilaterally BACK:  No CVA tenderness CHEST:  Unremarkable HEART:  PMI not displaced or sustained,S1 and S2 within normal limits, no S3, no S4, no clicks, no rubs, no murmurs ABD:  Flat, positive bowel sounds normal in frequency in pitch, no bruits, no rebound, no guarding, no midline pulsatile mass, no hepatomegaly,  no splenomegaly EXT:  2 plus pulses throughout, no edema, no cyanosis no clubbing SKIN:  No rashes no nodules NEURO:  Cranial nerves II through XII grossly intact, motor grossly intact throughout PSYCH:  Cognitively intact, oriented to person place and time    EKG:  EKG is ordered today. The ekg ordered today demonstrates sinus rhythm, rate 73, axis within normal limits, intervals within normal limits, no acute ST-T wave changes.   Recent Labs: 02/18/2020: ALT 20; TSH 2.230 04/06/2020: BUN 19; Creatinine, Ser 1.03; Hemoglobin 14.9; Platelets 155; Potassium 3.9; Sodium 136    Lipid Panel    Component Value Date/Time   CHOL 200 (H) 02/18/2020 0828   TRIG 99 02/18/2020 0828   HDL 36 (L) 02/18/2020 0828   CHOLHDL 5.6 (H) 02/18/2020  0828   LDLCALC 146 (H) 02/18/2020 0828      Wt Readings from Last 3 Encounters:  05/05/20 212 lb 6.4 oz (96.3 kg)  04/14/20 217 lb (98.4 kg)  04/06/20 209 lb (94.8 kg)      Other studies Reviewed: Additional studies/ records that were reviewed today include: ED records. Review of the above records demonstrates:  Please see elsewhere in the note.     ASSESSMENT AND PLAN:  PRECORDIAL CHEST PAIN: His chest pain does not have classic features for anginal pain but he does have significant cardiovascular risk factors. Screening with a POET (Plain Old Exercise Treadmill) is indicated.  I will also order a calcium score.  DYSLIPIDEMIA: LDL was 146 but has been following a Mediterranean diet. I will check a lipid profile when he comes back.   Current medicines are reviewed at length with the patient today.  The patient does not have concerns regarding medicines.  The following changes have been made:  no change  Labs/ tests ordered today include:   Orders Placed This Encounter  Procedures  . CT CARDIAC SCORING  . Lipid panel  . Cardiac Stress Test: Informed Consent Details: Physician/Practitioner Attestation; Transcribe to consent form and obtain patient signature  . EXERCISE TOLERANCE TEST (ETT)  . EKG 12-Lead     Disposition:   FU with me as needed   Signed, Minus Breeding, MD  05/05/2020 2:23 PM    [place]

## 2020-05-05 ENCOUNTER — Ambulatory Visit (INDEPENDENT_AMBULATORY_CARE_PROVIDER_SITE_OTHER): Payer: Medicare Other | Admitting: Cardiology

## 2020-05-05 ENCOUNTER — Encounter: Payer: Self-pay | Admitting: Cardiology

## 2020-05-05 ENCOUNTER — Other Ambulatory Visit: Payer: Self-pay

## 2020-05-05 VITALS — BP 130/76 | HR 73 | Ht 69.0 in | Wt 212.4 lb

## 2020-05-05 DIAGNOSIS — R079 Chest pain, unspecified: Secondary | ICD-10-CM | POA: Diagnosis not present

## 2020-05-05 DIAGNOSIS — E785 Hyperlipidemia, unspecified: Secondary | ICD-10-CM

## 2020-05-05 NOTE — Patient Instructions (Signed)
Medication Instructions:  No changes *If you need a refill on your cardiac medications before your next appointment, please call your pharmacy*  Lab Work: Your physician recommends that you return for lab work when you come back for your coronary calcium score or your exercise tolerance test. The lab work is FASTING. If you have labs (blood work) drawn today and your tests are completely normal, you will receive your results only by: Marland Kitchen MyChart Message (if you have MyChart) OR . A paper copy in the mail If you have any lab test that is abnormal or we need to change your treatment, we will call you to review the results.  Testing/Procedures: Your physician has requested that you have an exercise tolerance test. For further information please visit [email or web address]. Please also follow instruction sheet, as given.  Coronary Calcium Score  Follow-Up: At [facility], you and your health needs are our priority.  As part of our continuing mission to provide you with exceptional heart care, we have created designated Provider Care Teams.  These Care Teams include your primary Cardiologist (physician) and Advanced Practice Providers (APPs -  Physician Assistants and Nurse Practitioners) who all work together to provide you with the care you need, when you need it.   Your next appointment:   Follow up as needed   Other Instructions Dr. Percival Spanish has ordered a CT coronary calcium score. This test is done at [address]. This is $150 out of pocket.  Coronary CalciumScan A coronary calcium scan is an imaging test used to look for deposits of calcium and other fatty materials (plaques) in the inner lining of the blood vessels of the heart (coronary arteries). These deposits of calcium and plaques can partly clog and narrow the coronary arteries without producing any symptoms or warning signs. This puts a person at risk for a heart attack. This test can detect these deposits  before symptoms develop. Tell a health care provider about:  Any allergies you have.  All medicines you are taking, including vitamins, herbs, eye drops, creams, and over-the-counter medicines.  Any problems you or family members have had with anesthetic medicines.  Any blood disorders you have.  Any surgeries you have had.  Any medical conditions you have.  Whether you are pregnant or may be pregnant. What are the risks? Generally, this is a safe procedure. However, problems may occur, including:  Harm to a pregnant woman and her unborn baby. This test involves the use of radiation. Radiation exposure can be dangerous to a pregnant woman and her unborn baby. If you are pregnant, you generally should not have this procedure done.  Slight increase in the risk of cancer. This is because of the radiation involved in the test. What happens before the procedure? No preparation is needed for this procedure. What happens during the procedure?  You will undress and remove any jewelry around your neck or chest.  You will put on a hospital gown.  Sticky electrodes will be placed on your chest. The electrodes will be connected to an electrocardiogram (ECG) machine to record a tracing of the electrical activity of your heart.  A CT scanner will take pictures of your heart. During this time, you will be asked to lie still and hold your breath for 2-3 seconds while a picture of your heart is being taken. The procedure may vary among health care providers and hospitals. What happens after the procedure?  You can get dressed.  You can return  to your normal activities.  It is up to you to get the results of your test. Ask your health care provider, or the department that is doing the test, when your results will be ready. Summary  A coronary calcium scan is an imaging test used to look for deposits of calcium and other fatty materials (plaques) in the inner lining of the blood vessels of  the heart (coronary arteries).  Generally, this is a safe procedure. Tell your health care provider if you are pregnant or may be pregnant.  No preparation is needed for this procedure.  A CT scanner will take pictures of your heart.  You can return to your normal activities after the scan is done. This information is not intended to replace advice given to you by your health care provider. Make sure you discuss any questions you have with your health care provider. Document Released: 11/09/2007 Document Revised: 04/01/2016 Document Reviewed: 04/01/2016 Elsevier Interactive Patient Education  2017 [employer].

## 2020-05-05 NOTE — Telephone Encounter (Signed)
Eric Hopkins,   See phone note. Patient had OV with cardiologist today 12/10, please review. He has ETT scheduled on 06/06/2020 and colon with Dr.Armbruster on 06/09/2020. Please advise.  Thank you, [name] pv

## 2020-05-07 NOTE — Telephone Encounter (Signed)
Robbin,  If his ETT is neg and he receives cardiac clearance he will be cleared for LEC.  Thanks,  Osvaldo Angst

## 2020-05-25 LAB — LIPID PANEL
Chol/HDL Ratio: 6.3 ratio — ABNORMAL HIGH (ref 0.0–5.0)
Cholesterol, Total: 200 mg/dL — ABNORMAL HIGH (ref 100–199)
HDL: 32 mg/dL — ABNORMAL LOW (ref >39)
LDL Chol Calc (NIH): 144 mg/dL — ABNORMAL HIGH (ref 0–99)
Triglycerides: 129 mg/dL (ref 0–149)
VLDL Cholesterol Cal: 24 mg/dL (ref 5–40)

## 2020-06-02 ENCOUNTER — Telehealth (HOSPITAL_COMMUNITY): Payer: Self-pay | Admitting: *Deleted

## 2020-06-02 ENCOUNTER — Other Ambulatory Visit (HOSPITAL_COMMUNITY)
Admission: RE | Admit: 2020-06-02 | Discharge: 2020-06-02 | Disposition: A | Discharge status: Home / Self Care | Payer: Medicare Other | Source: Ambulatory Visit | Attending: Cardiovascular Disease | Admitting: Cardiovascular Disease

## 2020-06-02 DIAGNOSIS — Z01812 Encounter for preprocedural laboratory examination: Secondary | ICD-10-CM | POA: Diagnosis present

## 2020-06-02 DIAGNOSIS — Z20822 Contact with and (suspected) exposure to covid-19: Secondary | ICD-10-CM | POA: Diagnosis not present

## 2020-06-02 LAB — SARS CORONAVIRUS 2 (TAT 6-24 HRS): SARS Coronavirus 2: NEGATIVE

## 2020-06-02 NOTE — Telephone Encounter (Signed)
Close encounter 

## 2020-06-06 ENCOUNTER — Other Ambulatory Visit: Payer: Self-pay

## 2020-06-06 ENCOUNTER — Ambulatory Visit (HOSPITAL_COMMUNITY)
Admission: RE | Admit: 2020-06-06 | Discharge: 2020-06-06 | Disposition: A | Discharge status: Home / Self Care | Payer: Medicare Other | Source: Ambulatory Visit | Attending: Cardiovascular Disease | Admitting: Cardiovascular Disease

## 2020-06-06 DIAGNOSIS — R079 Chest pain, unspecified: Secondary | ICD-10-CM | POA: Diagnosis present

## 2020-06-06 LAB — EXERCISE TOLERANCE TEST
Estimated workload: 10.3 METS
Exercise duration (min): 8 min
Exercise duration (sec): 33 s
MPHR: 174 {beats}/min
Peak HR: 169 {beats}/min
Percent HR: 98 %
Rest HR: 81 {beats}/min

## 2020-06-07 ENCOUNTER — Telehealth: Payer: Self-pay | Admitting: *Deleted

## 2020-06-07 ENCOUNTER — Telehealth: Payer: Self-pay | Admitting: Cardiology

## 2020-06-07 NOTE — Telephone Encounter (Signed)
Dr. Percival Spanish,  I am a nurse at [facility].  Eric Hopkins is scheduled for a colonoscopy with Dr. Havery Moros on 06-09-20.  I know he has ETT on 06-06-20 and has been seen for chest pain in December.  I know you need to discuss results with him.  I just wanted to see if he is ok to proceed with his procedure from a cardiac standpoint.  Thank you for your time.  Eric Hiss RN

## 2020-06-07 NOTE — Telephone Encounter (Signed)
Pt report he was calling to go over test results. Advised that MD's nurse would contact pt once MD has reviewed results. Pt verbalized understanding.

## 2020-06-07 NOTE — Telephone Encounter (Signed)
Pt is aware that message sent to Dr. Percival Spanish and we will reach out to him as soon as we hear from cardiology

## 2020-06-07 NOTE — Telephone Encounter (Signed)
See TE 04-14-20- pt needs cardiac clearance for procedure.  Pt had ETT on 06-06-20.  I called him to see if he has heard results from this and he states he hasn't heard from cardiologist.  I explained we need to hear from cardiologist and receive clearance before we can proceed with colonoscopy.  He states he will call cardiologist

## 2020-06-07 NOTE — Telephone Encounter (Signed)
The stress test was negative.  I am waiting for the coronary calcium score.  I don't see a reason he could not exercise.

## 2020-06-07 NOTE — Telephone Encounter (Signed)
Patient is requesting to discuss stress test results. 

## 2020-06-08 NOTE — Telephone Encounter (Signed)
Dr. Percival Spanish or Mrs. Simmons= Please advise if patient has cardiac clearance for his colonoscopy on 06/09/2020.  This patient will need to be notified to not complete his prep for the colon if does not have cardiac clearance for his procedure. Please advise as soon as possible as the patient will need to be contacted prior to 5:00 pm today with information. Thank you

## 2020-06-08 NOTE — Telephone Encounter (Signed)
Informed patient ETT was negative and that information has been sent to endoscopy nurse. Patient advised he may exercise. Patient verbalized understanding.

## 2020-06-08 NOTE — Telephone Encounter (Signed)
John, Is he ok for his colon tomorrow ?  Please advise

## 2020-06-08 NOTE — Telephone Encounter (Signed)
   Primary Cardiologist: Minus Breeding, MD  Chart reviewed as part of pre-operative protocol coverage. Given past medical history and time since last visit, based on ACC/AHA guidelines, Eric Hopkins would be at acceptable risk for the planned procedure without further cardiovascular testing. Recent ETT was negative for ischemia.   The patient was advised that if he develops new symptoms prior to surgery to contact our office to arrange for a follow-up visit, and he verbalized understanding.  I will route this recommendation to the requesting party via Epic fax function and remove from pre-op pool.  Please call with questions.  [place], [place] 06/08/2020, 3:47 PM

## 2020-06-08 NOTE — Telephone Encounter (Signed)
Eric Hopkins,  Yes he was cleared by [facility] and may have his procedure tomorrow at [facility].  Thanks,  Osvaldo Angst

## 2020-06-09 ENCOUNTER — Other Ambulatory Visit: Payer: Self-pay

## 2020-06-09 ENCOUNTER — Ambulatory Visit (AMBULATORY_SURGERY_CENTER): Payer: Medicare Other | Admitting: Gastroenterology

## 2020-06-09 ENCOUNTER — Encounter: Payer: Self-pay | Admitting: Gastroenterology

## 2020-06-09 VITALS — BP 101/72 | HR 67 | Temp 97.3°F | Resp 16 | Ht 68.5 in | Wt 217.0 lb

## 2020-06-09 DIAGNOSIS — Z1211 Encounter for screening for malignant neoplasm of colon: Secondary | ICD-10-CM | POA: Diagnosis not present

## 2020-06-09 DIAGNOSIS — D122 Benign neoplasm of ascending colon: Secondary | ICD-10-CM

## 2020-06-09 HISTORY — PX: COLONOSCOPY WITH PROPOFOL: SHX5780

## 2020-06-09 MED ORDER — SODIUM CHLORIDE 0.9 % IV SOLN: 500.0000 mL | Freq: Once | INTRAVENOUS | Status: DC

## 2020-06-09 MED ORDER — FLEET ENEMA 7-19 GM/118ML RE ENEM
1.0000 | ENEMA | Freq: Once | RECTAL | Status: AC
  Administered 2020-06-09: 1 via RECTAL

## 2020-06-09 NOTE — Op Note (Signed)
[place] Patient Name: Eric Hopkins Procedure Date: 06/09/2020 3:14 PM MRN: [id number] Endoscopist: Remo Lipps P. Havery Moros , MD Age: 50 Referring MD:  Date of Birth: 01-Oct-1970 Gender: Male Account #: [id number] Procedure:                Colonoscopy Indications:              Screening for colorectal malignant neoplasm, This                            is the patient's first colonoscopy Medicines:                Monitored Anesthesia Care Procedure:                Pre-Anesthesia Assessment:                           - Prior to the procedure, a History and Physical                            was performed, and patient medications and                            allergies were reviewed. The patient's tolerance of                            previous anesthesia was also reviewed. The risks                            and benefits of the procedure and the sedation                            options and risks were discussed with the patient.                            All questions were answered, and informed consent                            was obtained. Prior Anticoagulants: The patient has                            taken no previous anticoagulant or antiplatelet                            agents. ASA Grade Assessment: II - A patient with                            mild systemic disease. After reviewing the risks                            and benefits, the patient was deemed in                            satisfactory condition to undergo the procedure.  After obtaining informed consent, the colonoscope                            was passed under direct vision. Throughout the                            procedure, the patient's blood pressure, pulse, and                            oxygen saturations were monitored continuously. The                            Olympus CF-HQ190L [id number]) Colonoscope was                            introduced through the  anus and advanced to the the                            cecum, identified by appendiceal orifice and                            ileocecal valve. The colonoscopy was performed                            without difficulty. The patient tolerated the                            procedure well. The quality of the bowel                            preparation was adequate. The ileocecal valve,                            appendiceal orifice, and rectum were photographed. Scope In: 3:37:01 PM Scope Out: 4:11:18 PM Scope Withdrawal Time: 0 hours 30 minutes 38 seconds  Total Procedure Duration: 0 hours 34 minutes 17 seconds  Findings:                 The perianal and digital rectal examinations were                            normal.                           A moderate amount of liquid stool was found in the                            entire colon on initial entery. Lavage of the colon                            using copious amounts of sterile water was                            performed, resulting in clearance with good  visualization.                           A 3 mm polyp was found in the ascending colon. The                            polyp was sessile. The polyp was removed with a                            cold snare. Resection and retrieval were complete.                           Internal hemorrhoids were found during retroflexion.                           The exam was otherwise without abnormality. Complications:            No immediate complications. Estimated blood loss:                            Minimal. Estimated Blood Loss:     Estimated blood loss was minimal. Impression:               - Stool in the entire examined colon which led to                            time spent lavaging to achieve good views.                           - One 3 mm polyp in the ascending colon, removed                            with a cold snare. Resected and retrieved.                            - Internal hemorrhoids.                           - The examination was otherwise normal. Recommendation:           - Patient has a contact number available for                            emergencies. The signs and symptoms of potential                            delayed complications were discussed with the                            patient. Return to normal activities tomorrow.                            Written discharge instructions were provided to the  patient.                           - Resume previous diet.                           - Continue present medications.                           - Await pathology results. Remo Lipps P. Karolyne Timmons, MD 06/09/2020 4:16:00 PM This report has been signed electronically.

## 2020-06-09 NOTE — Patient Instructions (Signed)
Handouts Provided:  Polyps  YOU HAD AN ENDOSCOPIC PROCEDURE TODAY AT THE [facility]:   Refer to the procedure report that was given to you for any specific questions about what was found during the examination.  If the procedure report does not answer your questions, please call your gastroenterologist to clarify.  If you requested that your care partner not be given the details of your procedure findings, then the procedure report has been included in a sealed envelope for you to review at your convenience later.  YOU SHOULD EXPECT: Some feelings of bloating in the abdomen. Passage of more gas than usual.  Walking can help get rid of the air that was put into your GI tract during the procedure and reduce the bloating. If you had a lower endoscopy (such as a colonoscopy or flexible sigmoidoscopy) you may notice spotting of blood in your stool or on the toilet paper. If you underwent a bowel prep for your procedure, you may not have a normal bowel movement for a few days.  Please Note:  You might notice some irritation and congestion in your nose or some drainage.  This is from the oxygen used during your procedure.  There is no need for concern and it should clear up in a day or so.  SYMPTOMS TO REPORT IMMEDIATELY:   Following lower endoscopy (colonoscopy or flexible sigmoidoscopy):  Excessive amounts of blood in the stool  Significant tenderness or worsening of abdominal pains  Swelling of the abdomen that is new, acute  Fever of 100F or higher  For urgent or emergent issues, a gastroenterologist can be reached at any hour by calling [phone]. Do not use MyChart messaging for urgent concerns.    DIET:  We do recommend a small meal at first, but then you may proceed to your regular diet.  Drink plenty of fluids but you should avoid alcoholic beverages for 24 hours.  ACTIVITY:  You should plan to take it easy for the rest of today and you should NOT DRIVE or use heavy  machinery until tomorrow (because of the sedation medicines used during the test).    FOLLOW UP: Our staff will call the number listed on your records 48-72 hours following your procedure to check on you and address any questions or concerns that you may have regarding the information given to you following your procedure. If we do not reach you, we will leave a message.  We will attempt to reach you two times.  During this call, we will ask if you have developed any symptoms of COVID 19. If you develop any symptoms (ie: fever, flu-like symptoms, shortness of breath, cough etc.) before then, please call [phone].  If you test positive for Covid 19 in the 2 weeks post procedure, please call and report this information to us.    If any biopsies were taken you will be contacted by phone or by letter within the next 1-3 weeks.  Please call us at [phone] if you have not heard about the biopsies in 3 weeks.    SIGNATURES/CONFIDENTIALITY: You and/or your care partner have signed paperwork which will be entered into your electronic medical record.  These signatures attest to the fact that that the information above on your After Visit Summary has been reviewed and is understood.  Full responsibility of the confidentiality of this discharge information lies with you and/or your care-partner.  

## 2020-06-09 NOTE — Progress Notes (Signed)
PT taken to PACU. Monitors in place. VSS. Report given to RN. 

## 2020-06-09 NOTE — Progress Notes (Signed)
Medical history reviewed with no changes noted. VS assessed by A.G 

## 2020-06-09 NOTE — Progress Notes (Signed)
Enema administered as ordered. Per patient return was yellow/brown liquid.

## 2020-06-09 NOTE — Progress Notes (Signed)
Called to room to assist during endoscopic procedure.  Patient ID and intended procedure confirmed with present staff. Received instructions for my participation in the procedure from the performing physician.  

## 2020-06-13 ENCOUNTER — Telehealth: Payer: Self-pay

## 2020-06-13 NOTE — Telephone Encounter (Signed)
Covid-19 screening questions   Do you now or have you had a fever in the last 14 days? No.  Do you have any respiratory symptoms of shortness of breath or cough now or in the last 14 days?  Do you have any family members or close contacts with diagnosed or suspected Covid-19 in the past 14 days?  Have you been tested for Covid-19 and found to be positive?       Follow up Call-  Call back number 06/09/2020  Post procedure Call Back phone  # [phone]  Permission to leave phone message Yes     Patient questions:  Do you have a fever, pain , or abdominal swelling? No. Pain Score  0 *  Have you tolerated food without any problems? Yes.    Have you been able to return to your normal activities? Yes.    Do you have any questions about your discharge instructions: Diet   No. Medications  No. Follow up visit  No.  Do you have questions or concerns about your Care? No.  Actions: * If pain score is 4 or above: No action needed, pain <4.

## 2020-06-16 ENCOUNTER — Other Ambulatory Visit: Payer: Self-pay

## 2020-06-16 ENCOUNTER — Ambulatory Visit (INDEPENDENT_AMBULATORY_CARE_PROVIDER_SITE_OTHER)
Admission: RE | Admit: 2020-06-16 | Discharge: 2020-06-16 | Disposition: A | Discharge status: Home / Self Care | Payer: Self-pay | Source: Ambulatory Visit | Attending: Cardiology | Admitting: Cardiology

## 2020-06-16 DIAGNOSIS — R079 Chest pain, unspecified: Secondary | ICD-10-CM

## 2020-06-21 ENCOUNTER — Telehealth: Payer: Self-pay | Admitting: Physician Assistant

## 2020-06-21 NOTE — Telephone Encounter (Signed)
Patient's wife called in about his calcium test last week and it showed thickining in his pleural lining and wants to make sure he can wait until April to be seen or if he needs to come in sooner than he is scheduled. It was done at [facility] on [address]. It was done by Dr.Hochrein, thanks.

## 2020-06-21 NOTE — Telephone Encounter (Signed)
Patient wife (on [place]) is aware of results from Dr. Percival Spanish and agrees to waiting until April apt for follow up. AS, CMA

## 2020-06-21 NOTE — Telephone Encounter (Signed)
CT CARDIAC SCORING: Result Notes   Minus Breeding, MD  06/17/2020 11:27 AM EST      Zero calcium.  There is some trace pleural thickening.  This can be followed with repeat CXR in one year.  I would suggest follow up with the PCP.  Call Mr. Antos with the results and send results to Lorrene Reid, PA-C

## 2020-08-25 ENCOUNTER — Ambulatory Visit (INDEPENDENT_AMBULATORY_CARE_PROVIDER_SITE_OTHER): Payer: Medicare Other | Admitting: Physician Assistant

## 2020-08-25 ENCOUNTER — Encounter: Payer: Self-pay | Admitting: Physician Assistant

## 2020-08-25 VITALS — Ht 70.0 in | Wt 200.0 lb

## 2020-08-25 DIAGNOSIS — F419 Anxiety disorder, unspecified: Secondary | ICD-10-CM | POA: Diagnosis not present

## 2020-08-25 DIAGNOSIS — F32A Depression, unspecified: Secondary | ICD-10-CM

## 2020-08-25 DIAGNOSIS — E785 Hyperlipidemia, unspecified: Secondary | ICD-10-CM

## 2020-08-25 DIAGNOSIS — K219 Gastro-esophageal reflux disease without esophagitis: Secondary | ICD-10-CM | POA: Diagnosis not present

## 2020-08-25 NOTE — Progress Notes (Signed)
Telehealth office visit note for Eric Reid, PA-C- at Primary Care at [facility]   I connected with current patient today by telephone and verified that I am speaking with the correct person   . Location of the patient: Home . Location of the provider: Office - This visit type was conducted due to national recommendations for restrictions regarding the COVID-19 Pandemic (e.g. social distancing) in an effort to limit this patient's exposure and mitigate transmission in our community.    - No physical exam could be performed with this format, beyond that communicated to [place] by the patient/ family members as noted.   - Additionally my office staff/ schedulers were to discuss with the patient that there may be a monetary charge related to this service, depending on their medical insurance.  My understanding is that patient understood and consented to proceed.     _________________________________________________________________________________   History of Present Illness: Patient comes in to follow-up on mood. Reports has been battling allergies but is feeling better. Continues to follow-up with psychiatry. Reports medication compliance. Denies mood changes or SI/HI.  Patient was evaluated by cardiology December 2021 for nonspecific chest pain and reports a work-up was essentially normal. Took omeprazole which helped with chest discomfort so likely was acid reflux related.     GAD 7 : Generalized Anxiety Score 08/25/2020  Nervous, Anxious, on Edge 1  Control/stop worrying 1  Worry too much - different things 3  Trouble relaxing 0  Restless 0  Easily annoyed or irritable 0  Afraid - awful might happen 0  Total GAD 7 Score 5  Anxiety Difficulty Very difficult    Depression screen [facility] 2/9 08/25/2020 02/25/2020 06/28/2019 02/25/2019 09/17/2018  Decreased Interest 0 0 0 0 0  Down, Depressed, Hopeless 0 1 0 0 1  PHQ - 2 Score 0 1 0 0 1  Altered sleeping 0 0 0 0 1  Tired, decreased  energy 0 1 0 0 0  Change in appetite 0 0 0 0 0  Feeling bad or failure about yourself  0 0 0 0 1  Trouble concentrating 0 0 0 0 0  Moving slowly or fidgety/restless 0 0 0 0 0  Suicidal thoughts 0 0 0 0 0  PHQ-9 Score 0 2 0 0 3  Difficult doing work/chores - Not difficult at all - - Somewhat difficult      Impression and Recommendations:     1. Anxiety and depression   2. Dyslipidemia   3. Gastroesophageal reflux disease, unspecified whether esophagitis present     Anxiety and depression: -PHQ-9 score of 0, GAD-7 score of 5. -Recommend to continue to follow up with Psychiatry and current medication regimen.  Dyslipidemia: -Last lipid panel: total cholesterol 200, triglycerides 129, HDL 32, LDL 144 -ETT 06/06/2020 normal, coronary calcium score of 0. Cardiac CT revealed posterior pleural thickening- repeat CXR in 1 year. -Recommend to follow a heart healthy diet and stay as active as possible. -Will repeat lipid panel with [facility].  GERD: -Recommend to avoid potential provocative foods such as caffeine or spicy foods. - Recommend to use omeprazole as needed.   - As part of my medical decision making, I reviewed the following data within the [facility] History obtained from pt /family, CMA notes reviewed and incorporated if applicable, Labs reviewed, Radiograph/ tests reviewed if applicable and OV notes from prior OV's with me, as well as any other specialists she/he has seen since seeing me  last, were all reviewed and used in my medical decision making process today.    - Additionally, when appropriate, discussion had with patient regarding our treatment plan, and their biases/concerns about that plan were used in my medical decision making today.    - The patient agreed with the plan and demonstrated an understanding of the instructions.   No barriers to understanding were identified.     - The patient was advised to call back or seek an in-person evaluation if the  symptoms worsen or if the condition fails to improve as anticipated.   Return in about 6 months (around 02/24/2021) for [place] and [place].    No orders of the defined types were placed in this encounter.   No orders of the defined types were placed in this encounter.   There are no discontinued medications.     Time spent on telephone encounter was 8 minutes.      The [place] was signed into law in 2016 which includes the topic of electronic health records.  This provides immediate access to information in MyChart.  This includes consultation notes, operative notes, office notes, lab results and pathology reports.  If you have any questions about what you read please let us know at your next visit or call us at the office.  We are right here with you.   __________________________________________________________________________________     Patient Care Team    Relationship Specialty Notifications Start End  Eric Hopkins, [place] PCP - General Physician Assistant  02/18/20   Minus Breeding, MD PCP - Cardiology Cardiology  05/05/20   Minus Breeding, MD Consulting Physician Cardiology  05/05/20      -Vitals obtained; medications/ allergies reconciled;  personal medical, social, Sx etc.histories were updated by CMA, reviewed by me and are reflected in chart   Patient Active Problem List   Diagnosis Date Noted  . Hypertriglyceridemia 02/25/2019  . Anxiety and depression 09/17/2018  . Healthcare maintenance 09/17/2018     Current Meds  Medication Sig  . clonazePAM (KLONOPIN) 0.5 MG tablet Take 0.5 mg by mouth 2 (two) times daily as needed for anxiety.  Marland Kitchen FLUoxetine (PROZAC) 40 MG capsule Take 40 mg by mouth daily.  . risperiDONE (RISPERDAL) 2 MG tablet Take 2 mg by mouth at bedtime.     Allergies:  No Known Allergies   ROS:  See above HPI for pertinent positives and negatives   Objective:   Height 5\' 10"  (1.778 m), weight 200 lb (90.7 kg).  (if  some vitals are omitted, this means that patient was UNABLE to obtain them. ) General: A & O * 3; sounds in no acute distress Respiratory: speaking in full sentences, no conversational dyspnea Psych: insight appears good, mood- appears full

## 2020-10-05 ENCOUNTER — Encounter: Payer: Self-pay | Admitting: Nurse Practitioner

## 2020-10-05 ENCOUNTER — Ambulatory Visit (INDEPENDENT_AMBULATORY_CARE_PROVIDER_SITE_OTHER): Payer: Medicare Other | Admitting: Nurse Practitioner

## 2020-10-05 ENCOUNTER — Other Ambulatory Visit: Payer: Self-pay

## 2020-10-05 VITALS — BP 107/66 | HR 79 | Temp 97.9°F | Ht 71.0 in | Wt 201.3 lb

## 2020-10-05 DIAGNOSIS — R350 Frequency of micturition: Secondary | ICD-10-CM | POA: Diagnosis not present

## 2020-10-05 DIAGNOSIS — Z125 Encounter for screening for malignant neoplasm of prostate: Secondary | ICD-10-CM

## 2020-10-05 LAB — POCT URINALYSIS DIPSTICK
Bilirubin, UA: NEGATIVE
Blood, UA: NEGATIVE
Glucose, UA: NEGATIVE
Ketones, UA: NEGATIVE
Leukocytes, UA: NEGATIVE
Nitrite, UA: NEGATIVE
Protein, UA: NEGATIVE
Spec Grav, UA: 1.02 (ref 1.010–1.025)
Urobilinogen, UA: 0.2 E.U./dL
pH, UA: 5.5 (ref 5.0–8.0)

## 2020-10-05 NOTE — Progress Notes (Signed)
Established Patient Office Visit  Subjective:  Patient ID: Eric Hopkins, male    DOB: 1970-10-18  Age: 50 y.o. MRN: [id number]  CC:  Chief Complaint  Patient presents with  . Urinary Frequency    HPI [name] presents for evaluation of urinary frequency. He states this has been a problem for some time. He states that he did bring this up during his physical last year. A prostate check was performed. He states he was told that prostate may be a little enlarged, but nothing that should be symptomatic. He states that urinary frequency continues to be a problem and continues to worsen. He has a greet deal of trouble holding his urine and when he does, he often has to relieve his bladder urgently. He denies burning with urination. He denies blood in the urine. He denies abdominal pain, nausea, or vomiting. He denies changes in bowel habits. He denies headache, fever, body aches, or chills.   Past Medical History:  Diagnosis Date  . Anxiety   . Depression   . GERD (gastroesophageal reflux disease)     Past Surgical History:  Procedure Laterality Date  . CARPAL TUNNEL RELEASE Right   . COLONOSCOPY     >10 yrs ago.  . TONSILLECTOMY AND ADENOIDECTOMY      Family History  Problem Relation Age of Onset  . Hypertension Mother   . Anxiety disorder Mother   . Depression Mother   . Heart attack Father 33       Died in his sleep age 22  . Depression Maternal Grandmother   . Anxiety disorder Maternal Grandmother   . Hypertension Maternal Grandmother   . Alcohol abuse Maternal Grandfather   . Colon cancer Neg Hx   . Colon polyps Neg Hx   . Esophageal cancer Neg Hx   . Rectal cancer Neg Hx   . Stomach cancer Neg Hx     Social History   Socioeconomic History  . Marital status: Significant Other    Spouse name: Not on file  . Number of children: Not on file  . Years of education: Not on file  . Highest education level: Not on file  Occupational History  . Not on file   Tobacco Use  . Smoking status: Former Smoker    Packs/day: 0.50    Years: 4.00    Pack years: 2.00    Types: Cigarettes    Quit date: 05/28/1995    Years since quitting: 25.3  . Smokeless tobacco: Never Used  Vaping Use  . Vaping Use: Never used  Substance and Sexual Activity  . Alcohol use: Yes    Comment: occassionally  . Drug use: Yes    Frequency: 1.0 times per week    Types: Marijuana    Comment: has not in last few months  . Sexual activity: Yes    Birth control/protection: None  Other Topics Concern  . Not on file  Social History Narrative   Lives with wife and has a 66 year old.    Social Determinants of Health   Financial Resource Strain: Not on file  Food Insecurity: Not on file  Transportation Needs: Not on file  Physical Activity: Not on file  Stress: Not on file  Social Connections: Not on file  Intimate Partner Violence: Not on file    Outpatient Medications Prior to Visit  Medication Sig Dispense Refill  . clonazePAM (KLONOPIN) 0.5 MG tablet Take 0.5 mg by mouth 2 (two) times daily  as needed for anxiety.    Marland Kitchen FLUoxetine (PROZAC) 40 MG capsule Take 40 mg by mouth daily.    . risperiDONE (RISPERDAL) 2 MG tablet Take 2 mg by mouth at bedtime.     No facility-administered medications prior to visit.    No Known Allergies  ROS Review of Systems  Constitutional: Negative for activity change, chills and fever.  HENT: Negative for congestion, postnasal drip, rhinorrhea, sinus pressure and sinus pain.   Eyes: Negative.   Respiratory: Negative for cough, chest tightness, shortness of breath and wheezing.   Cardiovascular: Negative for chest pain and palpitations.  Gastrointestinal: Negative for constipation, diarrhea, nausea and vomiting.  Genitourinary: Positive for frequency and urgency. Negative for dysuria and hematuria.  Musculoskeletal: Negative for back pain and myalgias.  Skin: Negative for rash.  Allergic/Immunologic: Negative.   Neurological:  Negative for dizziness, weakness and headaches.  Hematological: Negative.   Psychiatric/Behavioral: Positive for dysphoric mood. The patient is nervous/anxious.        Well managed on current medications and dosages.  All other systems reviewed and are negative.     Objective:    Physical Exam Vitals and nursing note reviewed.  Constitutional:      Appearance: Normal appearance. He is well-developed.  HENT:     Head: Normocephalic.  Eyes:     Extraocular Movements: Extraocular movements intact.     Conjunctiva/sclera: Conjunctivae normal.     Pupils: Pupils are equal, round, and reactive to light.  Cardiovascular:     Rate and Rhythm: Normal rate and regular rhythm.     Pulses: Normal pulses.     Heart sounds: Normal heart sounds.  Pulmonary:     Effort: Pulmonary effort is normal.     Breath sounds: Normal breath sounds.  Abdominal:     General: Bowel sounds are normal.     Palpations: Abdomen is soft.     Tenderness: There is no abdominal tenderness.  Genitourinary:    Comments: Urine sample is negative for abnormalities at this time.  Musculoskeletal:        General: Normal range of motion.     Cervical back: Normal range of motion and neck supple.  Skin:    General: Skin is warm and dry.     Capillary Refill: Capillary refill takes less than 2 seconds.  Neurological:     General: No focal deficit present.     Mental Status: He is alert and oriented to person, place, and time.  Psychiatric:        Mood and Affect: Mood normal.        Behavior: Behavior normal.        Thought Content: Thought content normal.        Judgment: Judgment normal.     Today's Vitals   10/05/20 0941  BP: 107/66  Pulse: 79  Temp: 97.9 F (36.6 C)  SpO2: 98%  Weight: 201 lb 4.8 oz (91.3 kg)  Height: 5\' 11"  (1.803 m)   Body mass index is 28.08 kg/m.   Wt Readings from Last 3 Encounters:  10/05/20 201 lb 4.8 oz (91.3 kg)  08/25/20 200 lb (90.7 kg)  06/09/20 217 lb (98.4 kg)      Health Maintenance Due  Topic Date Due  . COVID-19 Vaccine (1) Never done  . HIV Screening  Never done  . Hepatitis C Screening  Never done    There are no preventive care reminders to display for this patient.  Lab Results  Component  Value Date   TSH 2.230 02/18/2020   Lab Results  Component Value Date   WBC 7.0 04/06/2020   HGB 14.9 04/06/2020   HCT 44.8 04/06/2020   MCV 87.2 04/06/2020   PLT 155 04/06/2020   Lab Results  Component Value Date   NA 136 04/06/2020   K 3.9 04/06/2020   CO2 22 04/06/2020   GLUCOSE 119 (H) 04/06/2020   BUN 19 04/06/2020   CREATININE 1.03 04/06/2020   BILITOT 0.2 02/18/2020   ALKPHOS 65 02/18/2020   AST 14 02/18/2020   ALT 20 02/18/2020   PROT 7.4 02/18/2020   ALBUMIN 4.8 02/18/2020   CALCIUM 9.4 04/06/2020   ANIONGAP 9 04/06/2020   Lab Results  Component Value Date   CHOL 200 (H) 05/24/2020   Lab Results  Component Value Date   HDL 32 (L) 05/24/2020   Lab Results  Component Value Date   LDLCALC 144 (H) 05/24/2020   Lab Results  Component Value Date   TRIG 129 05/24/2020   Lab Results  Component Value Date   CHOLHDL 6.3 (H) 05/24/2020   Lab Results  Component Value Date   HGBA1C 5.1 06/28/2019      Assessment & Plan:  1. Urinary frequency Urine sample showing no evidence of infection or other abnormalities. Will send urine for culture and sensitivity. Refer to urology for further evaluation and treatment.  - POCT Urinalysis Dipstick - Ambulatory referral to Urology - Urine Culture  2. Screening for prostate cancer Will check PSA with labs tomorrow. Refer to urology for further evaluation.  - Ambulatory referral to Urology  Problem List Items Addressed This Visit   None   Visit Diagnoses    Urinary frequency    -  Primary   Relevant Orders   POCT Urinalysis Dipstick (Completed)   Ambulatory referral to Urology   Urine Culture   Screening for prostate cancer       Relevant Orders   Ambulatory  referral to Urology   Urine Culture       Follow-up: Return in about 1 day (around 10/06/2020) for labs - fasting lipids, PSA, HgbA1c, CBC, CMP, and TSH.  Follow up as scheduled. Marland Kitchen    Ronnell Freshwater, NP

## 2020-10-05 NOTE — Patient Instructions (Signed)

## 2020-10-06 ENCOUNTER — Other Ambulatory Visit: Payer: Medicare Other

## 2020-10-06 DIAGNOSIS — Z131 Encounter for screening for diabetes mellitus: Secondary | ICD-10-CM

## 2020-10-06 DIAGNOSIS — Z125 Encounter for screening for malignant neoplasm of prostate: Secondary | ICD-10-CM

## 2020-10-06 DIAGNOSIS — E781 Pure hyperglyceridemia: Secondary | ICD-10-CM

## 2020-10-06 DIAGNOSIS — R350 Frequency of micturition: Secondary | ICD-10-CM

## 2020-10-06 DIAGNOSIS — Z0000 Encounter for general adult medical examination without abnormal findings: Secondary | ICD-10-CM

## 2020-10-06 DIAGNOSIS — R7301 Impaired fasting glucose: Secondary | ICD-10-CM

## 2020-10-06 DIAGNOSIS — E785 Hyperlipidemia, unspecified: Secondary | ICD-10-CM

## 2020-10-07 ENCOUNTER — Encounter: Payer: Self-pay | Admitting: Nurse Practitioner

## 2020-10-07 LAB — LIPID PANEL
Chol/HDL Ratio: 4.2 ratio (ref 0.0–5.0)
Cholesterol, Total: 167 mg/dL (ref 100–199)
HDL: 40 mg/dL (ref >39)
LDL Chol Calc (NIH): 112 mg/dL — ABNORMAL HIGH (ref 0–99)
Triglycerides: 80 mg/dL (ref 0–149)
VLDL Cholesterol Cal: 15 mg/dL (ref 5–40)

## 2020-10-07 LAB — COMPREHENSIVE METABOLIC PANEL
ALT: 15 IU/L (ref 0–44)
AST: 17 IU/L (ref 0–40)
Albumin/Globulin Ratio: 1.9 (ref 1.2–2.2)
Albumin: 4.6 g/dL (ref 4.0–5.0)
Alkaline Phosphatase: 67 IU/L (ref 44–121)
BUN/Creatinine Ratio: 12 (ref 9–20)
BUN: 13 mg/dL (ref 6–24)
Bilirubin Total: 0.4 mg/dL (ref 0.0–1.2)
CO2: 24 mmol/L (ref 20–29)
Calcium: 9.3 mg/dL (ref 8.7–10.2)
Chloride: 103 mmol/L (ref 96–106)
Creatinine, Ser: 1.05 mg/dL (ref 0.76–1.27)
Globulin, Total: 2.4 g/dL (ref 1.5–4.5)
Glucose: 91 mg/dL (ref 65–99)
Potassium: 4.2 mmol/L (ref 3.5–5.2)
Sodium: 139 mmol/L (ref 134–144)
Total Protein: 7 g/dL (ref 6.0–8.5)
eGFR: 88 mL/min/{1.73_m2} (ref >59)

## 2020-10-07 LAB — CBC
Hematocrit: 44.8 % (ref 37.5–51.0)
Hemoglobin: 15 g/dL (ref 13.0–17.7)
MCH: 29.2 pg (ref 26.6–33.0)
MCHC: 33.5 g/dL (ref 31.5–35.7)
MCV: 87 fL (ref 79–97)
Platelets: 176 10*3/uL (ref 150–450)
RBC: 5.13 x10E6/uL (ref 4.14–5.80)
RDW: 12.9 % (ref 11.6–15.4)
WBC: 6.8 10*3/uL (ref 3.4–10.8)

## 2020-10-07 LAB — URINE CULTURE: Organism ID, Bacteria: NO GROWTH

## 2020-10-07 LAB — TSH: TSH: 3.06 u[IU]/mL (ref 0.450–4.500)

## 2020-10-07 LAB — PSA: Prostate Specific Ag, Serum: 0.3 ng/mL (ref 0.0–4.0)

## 2020-10-07 LAB — HEMOGLOBIN A1C
Est. average glucose Bld gHb Est-mCnc: 100 mg/dL
Hgb A1c MFr Bld: 5.1 % (ref 4.8–5.6)

## 2020-10-09 ENCOUNTER — Encounter: Payer: Self-pay | Admitting: Nurse Practitioner

## 2020-10-09 NOTE — Progress Notes (Signed)
Labs good. MyChart message sent to patient.

## 2020-11-29 ENCOUNTER — Other Ambulatory Visit: Payer: Self-pay

## 2020-11-29 ENCOUNTER — Ambulatory Visit (INDEPENDENT_AMBULATORY_CARE_PROVIDER_SITE_OTHER): Payer: Medicare Other | Admitting: Nurse Practitioner

## 2020-11-29 VITALS — BP 111/72 | HR 84 | Temp 97.7°F | Ht 71.0 in | Wt 192.2 lb

## 2020-11-29 DIAGNOSIS — F32A Depression, unspecified: Secondary | ICD-10-CM | POA: Diagnosis not present

## 2020-11-29 DIAGNOSIS — R350 Frequency of micturition: Secondary | ICD-10-CM

## 2020-11-29 DIAGNOSIS — F419 Anxiety disorder, unspecified: Secondary | ICD-10-CM | POA: Diagnosis not present

## 2020-11-29 LAB — POCT URINALYSIS DIPSTICK
Bilirubin, UA: NEGATIVE
Blood, UA: NEGATIVE
Glucose, UA: NEGATIVE
Ketones, UA: NEGATIVE
Leukocytes, UA: NEGATIVE
Nitrite, UA: NEGATIVE
Protein, UA: NEGATIVE
Spec Grav, UA: 1.01 (ref 1.010–1.025)
Urobilinogen, UA: 0.2 E.U./dL
pH, UA: 6.5 (ref 5.0–8.0)

## 2020-11-29 MED ORDER — OXYBUTYNIN CHLORIDE 5 MG PO TABS: 5.0000 mg | ORAL_TABLET | Freq: Two times a day (BID) | ORAL | 1 refills | Status: DC

## 2020-11-29 NOTE — Progress Notes (Signed)
Acute Office Visit  Subjective:    Patient ID: Eric Hopkins, male    DOB: Jun 25, 1970, 50 y.o.   MRN: [id number]  Chief Complaint  Patient presents with   Urinary Tract Infection    HPI Patient is in today for evaluation of urinary frequency.  This has been ongoing problem for some time.  He states that he does drink a lot of fluid.  States that he is unable to urinate when he has the urge to urinate, and has not had a return.  He is having bladder pain.  Describes his pain as cramping.  Urine is very clear.  He states that he does have increased anxiety.  He states he sees psychiatry on a basis.  Thinks there may be a possibility that increased anxiety may be causing increased urinary frequency.  He does have an upcoming appointment to see urology.  Patient denies nausea, vomiting, or abdominal pain.  He denies fever.  He did have labs done 10/06/2020.  He had normal PSA and CMP.  Past Medical History:  Diagnosis Date   Anxiety    Depression    GERD (gastroesophageal reflux disease)     Past Surgical History:  Procedure Laterality Date   CARPAL TUNNEL RELEASE Right    COLONOSCOPY     >10 yrs ago.   TONSILLECTOMY AND ADENOIDECTOMY      Family History  Problem Relation Age of Onset   Hypertension Mother    Anxiety disorder Mother    Depression Mother    Heart attack Father 60       Died in his sleep age 74   Depression Maternal Grandmother    Anxiety disorder Maternal Grandmother    Hypertension Maternal Grandmother    Alcohol abuse Maternal Grandfather    Colon cancer Neg Hx    Colon polyps Neg Hx    Esophageal cancer Neg Hx    Rectal cancer Neg Hx    Stomach cancer Neg Hx     Social History   Socioeconomic History   Marital status: Significant Other    Spouse name: Not on file   Number of children: Not on file   Years of education: Not on file   Highest education level: Not on file  Occupational History   Not on file  Tobacco Use   Smoking status: Former     Packs/day: 0.50    Years: 4.00    Pack years: 2.00    Types: Cigarettes    Quit date: 05/28/1995    Years since quitting: 25.5   Smokeless tobacco: Never  Vaping Use   Vaping Use: Never used  Substance and Sexual Activity   Alcohol use: Yes    Comment: occassionally   Drug use: Yes    Frequency: 1.0 times per week    Types: Marijuana    Comment: has not in last few months   Sexual activity: Yes    Birth control/protection: None  Other Topics Concern   Not on file  Social History Narrative   Lives with wife and has a 77 year old.    Social Determinants of Health   Financial Resource Strain: Not on file  Food Insecurity: Not on file  Transportation Needs: Not on file  Physical Activity: Not on file  Stress: Not on file  Social Connections: Not on file  Intimate Partner Violence: Not on file    Outpatient Medications Prior to Visit  Medication Sig Dispense Refill   clonazePAM (KLONOPIN) 0.5  MG tablet Take 0.5 mg by mouth 2 (two) times daily as needed for anxiety.     FLUoxetine (PROZAC) 40 MG capsule Take 40 mg by mouth daily.     risperiDONE (RISPERDAL) 2 MG tablet Take 2 mg by mouth at bedtime.     No facility-administered medications prior to visit.    No Known Allergies  Review of Systems  Constitutional:  Positive for fatigue. Negative for activity change, chills and fever.  HENT:  Negative for congestion, postnasal drip, rhinorrhea, sinus pressure and sinus pain.   Eyes: Negative.   Respiratory:  Negative for cough, chest tightness, shortness of breath and wheezing.   Cardiovascular:  Negative for chest pain and palpitations.  Gastrointestinal:  Negative for constipation, diarrhea, nausea and vomiting.  Endocrine: Negative for cold intolerance, heat intolerance, polydipsia and polyuria.  Genitourinary:  Positive for dysuria, frequency and urgency.  Musculoskeletal:  Negative for back pain and myalgias.  Skin:  Negative for rash.  Allergic/Immunologic:  Negative.   Neurological:  Negative for dizziness, weakness and headaches.  Psychiatric/Behavioral:  The patient is not nervous/anxious.       Objective:    Physical Exam Vitals and nursing note reviewed.  Constitutional:      Appearance: Normal appearance. He is well-developed.  HENT:     Head: Normocephalic and atraumatic.  Eyes:     Pupils: Pupils are equal, round, and reactive to light.  Cardiovascular:     Rate and Rhythm: Normal rate and regular rhythm.     Pulses: Normal pulses.     Heart sounds: Normal heart sounds.  Pulmonary:     Effort: Pulmonary effort is normal.     Breath sounds: Normal breath sounds.  Abdominal:     General: Bowel sounds are normal.     Palpations: Abdomen is soft.     Tenderness: There is no abdominal tenderness.  Genitourinary:    Comments: & Is clear, with no evidence of infection abnormality. Musculoskeletal:        General: Normal range of motion.     Cervical back: Normal range of motion and neck supple.  Lymphadenopathy:     Cervical: No cervical adenopathy.  Skin:    General: Skin is warm and dry.     Capillary Refill: Capillary refill takes less than 2 seconds.  Neurological:     General: No focal deficit present.     Mental Status: He is alert and oriented to person, place, and time.  Psychiatric:        Mood and Affect: Mood normal.        Behavior: Behavior normal.        Thought Content: Thought content normal.        Judgment: Judgment normal.    Today's Vitals   11/29/20 1319  BP: 111/72  Pulse: 84  Temp: 97.7 F (36.5 C)  SpO2: 96%  Weight: 192 lb 3.2 oz (87.2 kg)  Height: $Remove'5\' 11"'RFmmjqG$  (1.803 m)   Body mass index is 26.81 kg/m.   Wt Readings from Last 3 Encounters:  11/29/20 192 lb 3.2 oz (87.2 kg)  10/05/20 201 lb 4.8 oz (91.3 kg)  08/25/20 200 lb (90.7 kg)    Health Maintenance Due  Topic Date Due   COVID-19 Vaccine (1) Never done   HIV Screening  Never done   Hepatitis C Screening  Never done    There  are no preventive care reminders to display for this patient.   Lab Results  Component Value  Date   TSH 3.060 10/06/2020   Lab Results  Component Value Date   WBC 6.8 10/06/2020   HGB 15.0 10/06/2020   HCT 44.8 10/06/2020   MCV 87 10/06/2020   PLT 176 10/06/2020   Lab Results  Component Value Date   NA 139 10/06/2020   K 4.2 10/06/2020   CO2 24 10/06/2020   GLUCOSE 91 10/06/2020   BUN 13 10/06/2020   CREATININE 1.05 10/06/2020   BILITOT 0.4 10/06/2020   ALKPHOS 67 10/06/2020   AST 17 10/06/2020   ALT 15 10/06/2020   PROT 7.0 10/06/2020   ALBUMIN 4.6 10/06/2020   CALCIUM 9.3 10/06/2020   ANIONGAP 9 04/06/2020   EGFR 88 10/06/2020   Lab Results  Component Value Date   CHOL 167 10/06/2020   Lab Results  Component Value Date   HDL 40 10/06/2020   Lab Results  Component Value Date   LDLCALC 112 (H) 10/06/2020   Lab Results  Component Value Date   TRIG 80 10/06/2020   Lab Results  Component Value Date   CHOLHDL 4.2 10/06/2020   Lab Results  Component Value Date   HGBA1C 5.1 10/06/2020       Assessment & Plan:  1. Urinary frequency Urinalysis in the office is negative for evidence of infection or other abnormality.  Will send urine for culture and sensitivity and treat as indicated.  Started on oxybutynin 5 mg tablets up to twice daily as needed for urinary frequency and bladder spasms.  Patient to consult with urology as scheduled. - POCT Urinalysis Dipstick - oxybutynin (DITROPAN) 5 MG tablet; Take 1 tablet (5 mg total) by mouth 2 (two) times daily.  Dispense: 60 tablet; Refill: 1 - Urine Culture  2. Anxiety and depression Patient should continue routine visits with psychiatry pain  Problem List Items Addressed This Visit       Other   Anxiety and depression   Urinary frequency - Primary   Relevant Medications   oxybutynin (DITROPAN) 5 MG tablet   Other Relevant Orders   POCT Urinalysis Dipstick (Completed)   Urine Culture (Completed)      Meds ordered this encounter  Medications   oxybutynin (DITROPAN) 5 MG tablet    Sig: Take 1 tablet (5 mg total) by mouth 2 (two) times daily.    Dispense:  60 tablet    Refill:  1    Please fill with generic alternative preferred by patient's insurance    Order Specific Question:   Supervising Provider    Answer:   Beatrice Lecher D [[id number]]     Ronnell Freshwater, NP

## 2020-12-01 LAB — URINE CULTURE

## 2020-12-01 NOTE — Progress Notes (Signed)
No evidence of infection. Was given trial oxybutynin and will see urology next week

## 2020-12-03 ENCOUNTER — Encounter: Payer: Self-pay | Admitting: Nurse Practitioner

## 2020-12-03 DIAGNOSIS — R350 Frequency of micturition: Secondary | ICD-10-CM | POA: Insufficient documentation

## 2020-12-28 ENCOUNTER — Encounter: Payer: Self-pay | Admitting: Nurse Practitioner

## 2020-12-28 NOTE — Telephone Encounter (Signed)
Called pt advising him the pharmacist said it was just a computer generated message

## 2021-02-02 ENCOUNTER — Emergency Department (HOSPITAL_BASED_OUTPATIENT_CLINIC_OR_DEPARTMENT_OTHER)
Admission: EM | Admit: 2021-02-02 | Discharge: 2021-02-02 | Disposition: A | Discharge status: Home / Self Care | Payer: Medicare Other | Attending: Emergency Medicine | Admitting: Emergency Medicine

## 2021-02-02 ENCOUNTER — Other Ambulatory Visit: Payer: Self-pay

## 2021-02-02 ENCOUNTER — Encounter (HOSPITAL_BASED_OUTPATIENT_CLINIC_OR_DEPARTMENT_OTHER): Payer: Self-pay | Admitting: *Deleted

## 2021-02-02 DIAGNOSIS — Z87891 Personal history of nicotine dependence: Secondary | ICD-10-CM | POA: Diagnosis not present

## 2021-02-02 DIAGNOSIS — M791 Myalgia, unspecified site: Secondary | ICD-10-CM | POA: Insufficient documentation

## 2021-02-02 DIAGNOSIS — R6889 Other general symptoms and signs: Secondary | ICD-10-CM

## 2021-02-02 DIAGNOSIS — F19230 Other psychoactive substance dependence with withdrawal, uncomplicated: Secondary | ICD-10-CM | POA: Insufficient documentation

## 2021-02-02 DIAGNOSIS — F419 Anxiety disorder, unspecified: Secondary | ICD-10-CM | POA: Insufficient documentation

## 2021-02-02 NOTE — ED Notes (Signed)
Pt. States out of Rx pain med. Now has withdrawal symptoms.

## 2021-02-02 NOTE — Discharge Instructions (Signed)
As we discussed based on the literature that I reviewed there is no medication or other intervention that I can give you to help with your withdrawal symptoms at this time.  If your symptoms persist for the next few days, I recommend contacting the NP who initially prescribed you oxybutynin, and you may consider doing a tapered stepdown from the medication.  Given that you only took the medication for about a month, I have strong suspicion that your symptoms will resolve in the next few days.  Please return if your symptoms worsen or fail to improve.

## 2021-02-02 NOTE — ED Triage Notes (Addendum)
Pt c/o withdrawal from medication, last dose x 3 days ago , sx include increased anxiety and body aches

## 2021-02-02 NOTE — ED Provider Notes (Signed)
[facility] Provider Note   CSN: [id number] Arrival date & time: 02/02/21  1612     History Chief Complaint  Patient presents with   Withdrawal    Eric Hopkins is a 50 y.o. male with a past medical history significant for anxiety and depression who presents with 3 days of body aches, increased anxiety, feeling of general malaise, and insomnia, with some diarrhea.  Patient reports he was given a prescription for oxybutynin at the end of July, took it as prescribed only 2 pills a day for the last month.  He reports he finished his prescription about 3 or 4 days ago, and immediately started having the symptoms.  He looked up the symptoms online and found evidence of reports of withdrawal from anticholinergic drugs.  Patient denies alcohol use, does report he has a benzo, klonopin, that he uses for anxiety, however is taking as prescribed, less than 1 pill/day on average.  Patient denies fever, abdominal pain, heart palpitations, shortness of breath, seizures.  HPI     Past Medical History:  Diagnosis Date   Anxiety    Depression    GERD (gastroesophageal reflux disease)     Patient Active Problem List   Diagnosis Date Noted   Urinary frequency 12/03/2020   Hypertriglyceridemia 02/25/2019   Anxiety and depression 09/17/2018   Healthcare maintenance 09/17/2018    Past Surgical History:  Procedure Laterality Date   CARPAL TUNNEL RELEASE Right    COLONOSCOPY     >10 yrs ago.   TONSILLECTOMY AND ADENOIDECTOMY         Family History  Problem Relation Age of Onset   Hypertension Mother    Anxiety disorder Mother    Depression Mother    Heart attack Father 62       Died in his sleep age 73   Depression Maternal Grandmother    Anxiety disorder Maternal Grandmother    Hypertension Maternal Grandmother    Alcohol abuse Maternal Grandfather    Colon cancer Neg Hx    Colon polyps Neg Hx    Esophageal cancer Neg Hx    Rectal cancer Neg Hx     Stomach cancer Neg Hx     Social History   Tobacco Use   Smoking status: Former    Packs/day: 0.50    Years: 4.00    Pack years: 2.00    Types: Cigarettes    Quit date: 05/28/1995    Years since quitting: 25.7   Smokeless tobacco: Never  Vaping Use   Vaping Use: Never used  Substance Use Topics   Alcohol use: Yes    Comment: occassionally   Drug use: Yes    Frequency: 1.0 times per week    Types: Marijuana    Comment: has not in last few months    Home Medications Prior to Admission medications   Medication Sig Start Date End Date Taking? Authorizing Provider  clonazePAM (KLONOPIN) 0.5 MG tablet Take 0.5 mg by mouth 2 (two) times daily as needed for anxiety.    [provider]  FLUoxetine (PROZAC) 40 MG capsule Take 40 mg by mouth daily.    [provider]  oxybutynin (DITROPAN) 5 MG tablet Take 1 tablet (5 mg total) by mouth 2 (two) times daily. 11/29/20   Ronnell Freshwater, NP  risperiDONE (RISPERDAL) 2 MG tablet Take 2 mg by mouth at bedtime.    [provider]    Allergies    Patient has no  known allergies.  Review of Systems   Review of Systems  Constitutional:  Positive for fatigue.  Respiratory:  Negative for chest tightness and shortness of breath.   Cardiovascular:  Negative for chest pain and palpitations.  Gastrointestinal:  Positive for diarrhea.  Neurological:  Positive for weakness.  Psychiatric/Behavioral:         Anxiety, insomnia   Physical Exam Updated Vital Signs BP 99/78   Pulse (S) 93   Temp 99.1 F (37.3 C) (Oral)   Resp 16   Ht '5\' 11"'$  (1.803 m)   Wt 78.9 kg   SpO2 97%   BMI 24.27 kg/m   Physical Exam Vitals and nursing note reviewed.  Constitutional:      General: He is not in acute distress.    Appearance: Normal appearance.  HENT:     Head: Normocephalic and atraumatic.  Eyes:     General:        Right eye: No discharge.        Left eye: No discharge.  Cardiovascular:     Rate and Rhythm: Normal  rate and regular rhythm.  Pulmonary:     Effort: Pulmonary effort is normal. No respiratory distress.  Abdominal:     General: Abdomen is flat.     Palpations: Abdomen is soft.     Tenderness: There is no abdominal tenderness.  Musculoskeletal:        General: No deformity.     Comments: No tremors  Skin:    General: Skin is warm and dry.  Neurological:     Mental Status: He is alert and oriented to person, place, and time. Mental status is at baseline.     Motor: No weakness.  Psychiatric:        Mood and Affect: Mood normal.        Behavior: Behavior normal.    ED Results / Procedures / Treatments   Labs (all labs ordered are listed, but only abnormal results are displayed) Labs Reviewed - No data to display  EKG None  Radiology No results found.  Procedures Procedures   Medications Ordered in ED Medications - No data to display  ED Course  I have reviewed the triage vital signs and the nursing notes.  Pertinent labs & imaging results that were available during my care of the patient were reviewed by me and considered in my medical decision making (see chart for details).    MDM Rules/Calculators/A&P                         Literature reviewed and there are reports of oxybutynin withdrawal toxidrome. Usually in cases of excessive overuse but there are some reports of withdrawal after short term use. Discussed he can continued taking his at home klonopin prn for withdrawal symptoms and anxiety. No evidence of possibility for seizure or other worsening of clinical condition. Patient has stable vital signs and otherwise appears well during evaluation. Discussed if symptoms persist he may consider talking to his PCP about starting a taper of the oxybutyin. Otherwise symptoms should improve hopefully in the near future. Return precautions given. Final Clinical Impression(s) / ED Diagnoses Final diagnoses:  Withdrawal complaint    Rx / DC Orders ED Discharge Orders      None        Anselmo Pickler, PA-C 02/02/21 1804    Malvin Johns, MD 02/02/21 1807

## 2021-02-26 ENCOUNTER — Other Ambulatory Visit: Payer: Medicare Other

## 2021-02-26 DIAGNOSIS — E785 Hyperlipidemia, unspecified: Secondary | ICD-10-CM

## 2021-02-26 DIAGNOSIS — Z0000 Encounter for general adult medical examination without abnormal findings: Secondary | ICD-10-CM

## 2021-02-26 DIAGNOSIS — Z125 Encounter for screening for malignant neoplasm of prostate: Secondary | ICD-10-CM

## 2021-02-27 LAB — COMPREHENSIVE METABOLIC PANEL
ALT: 14 IU/L (ref 0–44)
AST: 21 IU/L (ref 0–40)
Albumin/Globulin Ratio: 1.8 (ref 1.2–2.2)
Albumin: 4.6 g/dL (ref 4.0–5.0)
Alkaline Phosphatase: 57 IU/L (ref 44–121)
BUN/Creatinine Ratio: 8 — ABNORMAL LOW (ref 9–20)
BUN: 8 mg/dL (ref 6–24)
Bilirubin Total: 0.6 mg/dL (ref 0.0–1.2)
CO2: 22 mmol/L (ref 20–29)
Calcium: 9.6 mg/dL (ref 8.7–10.2)
Chloride: 103 mmol/L (ref 96–106)
Creatinine, Ser: 0.95 mg/dL (ref 0.76–1.27)
Globulin, Total: 2.5 g/dL (ref 1.5–4.5)
Glucose: 85 mg/dL (ref 70–99)
Potassium: 4.5 mmol/L (ref 3.5–5.2)
Sodium: 139 mmol/L (ref 134–144)
Total Protein: 7.1 g/dL (ref 6.0–8.5)
eGFR: 98 mL/min/{1.73_m2} (ref >59)

## 2021-02-27 LAB — LIPID PANEL
Chol/HDL Ratio: 3.9 ratio (ref 0.0–5.0)
Cholesterol, Total: 164 mg/dL (ref 100–199)
HDL: 42 mg/dL (ref >39)
LDL Chol Calc (NIH): 112 mg/dL — ABNORMAL HIGH (ref 0–99)
Triglycerides: 50 mg/dL (ref 0–149)
VLDL Cholesterol Cal: 10 mg/dL (ref 5–40)

## 2021-02-27 LAB — TSH: TSH: 1.89 u[IU]/mL (ref 0.450–4.500)

## 2021-03-02 ENCOUNTER — Ambulatory Visit (INDEPENDENT_AMBULATORY_CARE_PROVIDER_SITE_OTHER): Payer: Medicare Other | Admitting: Physician Assistant

## 2021-03-02 ENCOUNTER — Other Ambulatory Visit: Payer: Self-pay

## 2021-03-02 ENCOUNTER — Encounter: Payer: Self-pay | Admitting: Physician Assistant

## 2021-03-02 VITALS — BP 104/72 | HR 75 | Temp 97.5°F | Ht 69.0 in | Wt 176.8 lb

## 2021-03-02 DIAGNOSIS — E785 Hyperlipidemia, unspecified: Secondary | ICD-10-CM

## 2021-03-02 DIAGNOSIS — Z23 Encounter for immunization: Secondary | ICD-10-CM

## 2021-03-02 DIAGNOSIS — Z0000 Encounter for general adult medical examination without abnormal findings: Secondary | ICD-10-CM

## 2021-03-02 NOTE — Progress Notes (Signed)
Subjective:   Eric Hopkins is a 50 y.o. male who presents for Medicare Annual/Subsequent preventive examination.  Review of Systems    General:   No F/C, wt loss Pulm:   No DIB, SOB, pleuritic chest pain Card:  No CP, palpitations Abd:  No n/v/d or pain Ext:  No edema    Objective:    Today's Vitals   03/02/21 0846  BP: 104/72  Pulse: 75  Temp: (!) 97.5 F (36.4 C)  SpO2: 99%  Weight: 176 lb 12.8 oz (80.2 kg)  Height: 5\' 9"  (1.753 m)   Body mass index is 26.11 kg/m.  Advanced Directives 04/06/2020 09/17/2018  Does Patient Have a Medical Advance Directive? No No  Would patient like information on creating a medical advance directive? - No - Patient declined    Current Medications (verified) Outpatient Encounter Medications as of 03/02/2021  Medication Sig   alfuzosin (UROXATRAL) 10 MG 24 hr tablet Take 10 mg by mouth at bedtime.   clonazePAM (KLONOPIN) 0.5 MG tablet Take 0.5 mg by mouth 2 (two) times daily as needed for anxiety.   FLUoxetine (PROZAC) 40 MG capsule Take 40 mg by mouth daily.   risperiDONE (RISPERDAL) 2 MG tablet Take 2 mg by mouth at bedtime.   [DISCONTINUED] oxybutynin (DITROPAN) 5 MG tablet Take 1 tablet (5 mg total) by mouth 2 (two) times daily.   No facility-administered encounter medications on file as of 03/02/2021.    Allergies (verified) Patient has no known allergies.   History: Past Medical History:  Diagnosis Date   Anxiety    Depression    GERD (gastroesophageal reflux disease)    Past Surgical History:  Procedure Laterality Date   CARPAL TUNNEL RELEASE Right    COLONOSCOPY     >10 yrs ago.   TONSILLECTOMY AND ADENOIDECTOMY     Family History  Problem Relation Age of Onset   Hypertension Mother    Anxiety disorder Mother    Depression Mother    Heart attack Father 42       Died in his sleep age 42   Depression Maternal Grandmother    Anxiety disorder Maternal Grandmother    Hypertension Maternal Grandmother     Alcohol abuse Maternal Grandfather    Colon cancer Neg Hx    Colon polyps Neg Hx    Esophageal cancer Neg Hx    Rectal cancer Neg Hx    Stomach cancer Neg Hx    Social History   Socioeconomic History   Marital status: Significant Other    Spouse name: Not on file   Number of children: Not on file   Years of education: Not on file   Highest education level: Not on file  Occupational History   Not on file  Tobacco Use   Smoking status: Former    Packs/day: 0.50    Years: 4.00    Pack years: 2.00    Types: Cigarettes    Quit date: 05/28/1995    Years since quitting: 25.7   Smokeless tobacco: Never  Vaping Use   Vaping Use: Never used  Substance and Sexual Activity   Alcohol use: Yes    Comment: occassionally   Drug use: Yes    Frequency: 1.0 times per week    Types: Marijuana    Comment: has not in last few months   Sexual activity: Yes    Birth control/protection: None  Other Topics Concern   Not on file  Social History Narrative  Lives with wife and has a 35 year old.    Social Determinants of Health   Financial Resource Strain: Not on file  Food Insecurity: Not on file  Transportation Needs: Not on file  Physical Activity: Not on file  Stress: Not on file  Social Connections: Not on file    Tobacco Counseling Counseling given: Not Answered   Diabetic? no  Activities of Daily Living In your present state of health, do you have any difficulty performing the following activities: 03/02/2021 11/29/2020  Hearing? N N  Vision? Y N  Difficulty concentrating or making decisions? N N  Walking or climbing stairs? N N  Dressing or bathing? N N  Doing errands, shopping? N N  Some recent data might be hidden    Patient Care Team: Lorrene Reid, PA-C as PCP - General (Physician Assistant) Minus Breeding, MD as PCP - Cardiology (Cardiology) Minus Breeding, MD as Consulting Physician (Cardiology)  Indicate any recent Medical Services you may have received  from other than [facility] providers in the past year (date may be approximate).     Assessment:   This is a routine wellness examination for [facility].  Hearing/Vision screen No results found.  Dietary issues and exercise activities discussed: -Follows Mediterranean diet. Walks 3 miles daily and uses resistance bands for toning.     Goals Addressed   None   Depression Screen PHQ 2/9 Scores 03/02/2021 11/29/2020 08/25/2020 02/25/2020 06/28/2019 02/25/2019 09/17/2018  PHQ - 2 Score 1 1 0 1 0 0 1  PHQ- 9 Score 2 3 0 2 0 0 3    Fall Risk Fall Risk  03/02/2021 11/29/2020 08/25/2020 02/25/2020 02/25/2019  Falls in the past year? 0 0 0 0 0  Number falls in past yr: 0 0 - - -  Injury with Fall? 0 0 - - -  Risk for fall due to : No Fall Risks - - - -  Follow up Falls evaluation completed Falls evaluation completed Falls evaluation completed Falls evaluation completed Falls evaluation completed    [place]:  Any stairs in or around the home? Yes  If so, are there any without handrails? No  Home free of loose throw rugs in walkways, pet beds, electrical cords, etc? Yes  Adequate lighting in your home to reduce risk of falls? Yes   ASSISTIVE DEVICES UTILIZED TO PREVENT FALLS:  Life alert? No  Use of a cane, walker or w/c? No  Grab bars in the bathroom? No  Shower chair or bench in shower? No  Elevated toilet seat or a handicapped toilet? No   TIMED UP AND GO:  Was the test performed? Yes .  Length of time to ambulate 10 feet: 10 sec.   Gait steady and fast without use of assistive device  Cognitive Function:  6CIT Screen 03/02/2021 02/25/2020  What Year? 0 points 0 points  What month? 0 points 0 points  What time? 0 points 0 points  Count back from 20 0 points 0 points  Months in reverse 0 points 0 points  Repeat phrase 8 points 4 points  Total Score 8 4    Immunizations Immunization History  Administered Date(s) Administered   Influenza,inj,Quad PF,6+  Mos 02/25/2019, 02/25/2020   Tdap 02/25/2019    TDAP status: Up to date  Flu Vaccine status: Completed at today's visit  Pneumococcal vaccine status: Declined,  Education has been provided regarding the importance of this vaccine but patient still declined. Advised may receive  this vaccine at local pharmacy or Health Dept. Aware to provide a copy of the vaccination record if obtained from local pharmacy or Health Dept. Verbalized acceptance and understanding.  Not required due to age.  Covid-19 vaccine status: Declined, Education has been provided regarding the importance of this vaccine but patient still declined. Advised may receive this vaccine at local pharmacy or Health Dept.or vaccine clinic. Aware to provide a copy of the vaccination record if obtained from local pharmacy or Health Dept. Verbalized acceptance and understanding.  Qualifies for Shingles Vaccine? No   Zostavax completed No   Shingrix Completed?: No.    Education has been provided regarding the importance of this vaccine. Patient has been advised to call insurance company to determine out of pocket expense if they have not yet received this vaccine. Advised may also receive vaccine at local pharmacy or Health Dept. Verbalized acceptance and understanding. Not required due to age.  Screening Tests Health Maintenance  Topic Date Due   COVID-19 Vaccine (1) Never done   HIV Screening  Never done   Hepatitis C Screening  Never done   INFLUENZA VACCINE  12/25/2020   COLONOSCOPY (Pts 45-54yrs Insurance coverage will need to be confirmed)  06/10/2027   TETANUS/TDAP  02/24/2029   HPV VACCINES  Aged Out    Health Maintenance  Health Maintenance Due  Topic Date Due   COVID-19 Vaccine (1) Never done   HIV Screening  Never done   Hepatitis C Screening  Never done   INFLUENZA VACCINE  12/25/2020    Colorectal cancer screening: Type of screening: Colonoscopy. Completed 06/09/2020. Repeat every 7 years  Lung Cancer  Screening: (Low Dose CT Chest recommended if Age 51-80 years, 30 pack-year currently smoking OR have quit w/in 15years.) does not qualify.   Lung Cancer Screening Referral: n/a  Additional Screening:  Hepatitis C Screening: does qualify; Not completed yet.  Vision Screening: Recommended annual ophthalmology exams for early detection of glaucoma and other disorders of the eye. Is the patient up to date with their annual eye exam?  Yes  Who is the provider or what is the name of the office in which the patient attends annual eye exams? Unable to remember, has upcoming appointment later this month If pt is not established with a provider, would they like to be referred to a provider to establish care? No .   Dental Screening: Recommended annual dental exams for proper oral hygiene  Community Resource Referral / Chronic Care Management: CRR required this visit?  No   CCM required this visit?  No      Plan:  -Discussed with patient most recent labs which are essentially within normal limits or stable from prior. The 10-year ASCVD risk score (Arnett DK, et al., 2019) is: 2% -Continue to follow up with urology, psychiatrist and therapist. -Continue good hydration. -Follow up in 6 months for HLD, mood and FBW  I have personally reviewed and noted the following in the patient's chart:   Medical and social history Use of alcohol, tobacco or illicit drugs  Current medications and supplements including opioid prescriptions. Patient is not currently taking opioid prescriptions. Functional ability and status Nutritional status Physical activity Advanced directives List of other physicians Hospitalizations, surgeries, and ER visits in previous 12 months Vitals Screenings to include cognitive, depression, and falls Referrals and appointments  In addition, I have reviewed and discussed with patient certain preventive protocols, quality metrics, and best practice recommendations. A written  personalized care plan for preventive  services as well as general preventive health recommendations were provided to patient.     Lorrene Reid, PA-C   03/02/2021

## 2021-03-02 NOTE — Patient Instructions (Signed)
Preventive Care 40-49 Years Old, Male Preventive care refers to lifestyle choices and visits with your health care provider that can promote health and wellness. This includes: A yearly physical exam. This is also called an annual wellness visit. Regular dental and eye exams. Immunizations. Screening for certain conditions. Healthy lifestyle choices, such as: Eating a healthy diet. Getting regular exercise. Not using drugs or products that contain nicotine and tobacco. Limiting alcohol use. What can I expect for my preventive care visit? Physical exam Your health care provider will check your: Height and weight. These may be used to calculate your BMI (body mass index). BMI is a measurement that tells if you are at a healthy weight. Heart rate and blood pressure. Body temperature. Skin for abnormal spots. Counseling Your health care provider may ask you questions about your: Past medical problems. Family's medical history. Alcohol, tobacco, and drug use. Emotional well-being. Home life and relationship well-being. Sexual activity. Diet, exercise, and sleep habits. Work and work environment. Access to firearms. What immunizations do I need? Vaccines are usually given at various ages, according to a schedule. Your health care provider will recommend vaccines for you based on your age, medical history, and lifestyle or other factors, such as travel or where you work. What tests do I need? Blood tests Lipid and cholesterol levels. These may be checked every 5 years, or more often if you are over 49 years old. Hepatitis C test. Hepatitis B test. Screening Lung cancer screening. You may have this screening every year starting at age 49 if you have a 30-pack-year history of smoking and currently smoke or have quit within the past 15 years. Prostate cancer screening. Recommendations will vary depending on your family history and other risks. Genital exam to check for testicular cancer  or hernias. Colorectal cancer screening. All adults should have this screening starting at age 49 and continuing until age 75. Your health care provider may recommend screening at age 45 if you are at increased risk. You will have tests every 1-10 years, depending on your results and the type of screening test. Diabetes screening. This is done by checking your blood sugar (glucose) after you have not eaten for a while (fasting). You may have this done every 1-3 years. STD (sexually transmitted disease) testing, if you are at risk. Follow these instructions at home: Eating and drinking  Eat a diet that includes fresh fruits and vegetables, whole grains, lean protein, and low-fat dairy products. Take vitamin and mineral supplements as recommended by your health care provider. Do not drink alcohol if your health care provider tells you not to drink. If you drink alcohol: Limit how much you have to 0-2 drinks a day. Be aware of how much alcohol is in your drink. In the [place] one drink equals one 12 oz bottle of beer (355 mL), one 5 oz glass of wine (148 mL), or one 1 oz glass of hard liquor (44 mL). Lifestyle Take daily care of your teeth and gums. Brush your teeth every morning and night with fluoride toothpaste. Floss one time each day. Stay active. Exercise for at least 30 minutes 5 or more days each week. Do not use any products that contain nicotine or tobacco, such as cigarettes, e-cigarettes, and chewing tobacco. If you need help quitting, ask your health care provider. Do not use drugs. If you are sexually active, practice safe sex. Use a condom or other form of protection to prevent STIs (sexually transmitted infections). If told by your   health care provider, take low-dose aspirin daily starting at age 49. Find healthy ways to cope with stress, such as: Meditation, yoga, or listening to music. Journaling. Talking to a trusted person. Spending time with friends and  family. Safety Always wear your seat belt while driving or riding in a vehicle. Do not drive: If you have been drinking alcohol. Do not ride with someone who has been drinking. When you are tired or distracted. While texting. Wear a helmet and other protective equipment during sports activities. If you have firearms in your house, make sure you follow all gun safety procedures. What's next? Go to your health care provider once a year for an annual wellness visit. Ask your health care provider how often you should have your eyes and teeth checked. Stay up to date on all vaccines. This information is not intended to replace advice given to you by your health care provider. Make sure you discuss any questions you have with your health care provider. Document Revised: 07/21/2020 Document Reviewed: 05/07/2018 Elsevier Patient Education  2022 Elsevier Inc.   

## 2021-03-14 ENCOUNTER — Encounter: Payer: Self-pay | Admitting: Physician Assistant

## 2021-03-14 ENCOUNTER — Ambulatory Visit (INDEPENDENT_AMBULATORY_CARE_PROVIDER_SITE_OTHER): Payer: Medicare Other | Admitting: Physician Assistant

## 2021-03-14 ENCOUNTER — Other Ambulatory Visit: Payer: Self-pay

## 2021-03-14 VITALS — BP 98/63 | HR 64 | Temp 97.8°F | Ht 69.0 in | Wt 178.0 lb

## 2021-03-14 DIAGNOSIS — K649 Unspecified hemorrhoids: Secondary | ICD-10-CM

## 2021-03-14 MED ORDER — LIDOCAINE-HYDROCORT (PERIANAL) 3-0.5 % EX CREA: TOPICAL_CREAM | CUTANEOUS | 0 refills | Status: DC

## 2021-03-14 NOTE — Patient Instructions (Signed)

## 2021-03-14 NOTE — Progress Notes (Signed)
Acute Office Visit  Subjective:    Patient ID: Eric Hopkins, male    DOB: 1971/03/19, 50 y.o.   MRN: [id number]  Chief Complaint  Patient presents with   Acute Visit   Hemorrhoids    HPI Patient is in today for c/o external hemorrhoids. States they flared-up last 2 weeks, they feel swollen and moving a certain way will irritate hemorrhoids. Has tried Preparation H pads which have not helped much. Denies bloody stool, fever, or chills. Reports drinks at least 64 fl oz of water daily and tries to have fiber with his diet. Denies hard stools or constipation.   Past Medical History:  Diagnosis Date   Anxiety    Depression    GERD (gastroesophageal reflux disease)     Past Surgical History:  Procedure Laterality Date   CARPAL TUNNEL RELEASE Right    COLONOSCOPY     >10 yrs ago.   TONSILLECTOMY AND ADENOIDECTOMY      Family History  Problem Relation Age of Onset   Hypertension Mother    Anxiety disorder Mother    Depression Mother    Heart attack Father 57       Died in his sleep age 25   Depression Maternal Grandmother    Anxiety disorder Maternal Grandmother    Hypertension Maternal Grandmother    Alcohol abuse Maternal Grandfather    Colon cancer Neg Hx    Colon polyps Neg Hx    Esophageal cancer Neg Hx    Rectal cancer Neg Hx    Stomach cancer Neg Hx     Social History   Socioeconomic History   Marital status: Significant Other    Spouse name: Not on file   Number of children: Not on file   Years of education: Not on file   Highest education level: Not on file  Occupational History   Not on file  Tobacco Use   Smoking status: Former    Packs/day: 0.50    Years: 4.00    Pack years: 2.00    Types: Cigarettes    Quit date: 05/28/1995    Years since quitting: 25.8   Smokeless tobacco: Never  Vaping Use   Vaping Use: Never used  Substance and Sexual Activity   Alcohol use: Yes    Comment: occassionally   Drug use: Yes    Frequency: 1.0 times per  week    Types: Marijuana    Comment: has not in last few months   Sexual activity: Yes    Birth control/protection: None  Other Topics Concern   Not on file  Social History Narrative   Lives with wife and has a 11 year old.    Social Determinants of Health   Financial Resource Strain: Not on file  Food Insecurity: Not on file  Transportation Needs: Not on file  Physical Activity: Not on file  Stress: Not on file  Social Connections: Not on file  Intimate Partner Violence: Not on file    Outpatient Medications Prior to Visit  Medication Sig Dispense Refill   alfuzosin (UROXATRAL) 10 MG 24 hr tablet Take 10 mg by mouth at bedtime.     clonazePAM (KLONOPIN) 0.5 MG tablet Take 0.5 mg by mouth 2 (two) times daily as needed for anxiety.     FLUoxetine HCl 60 MG TABS Take 60 mg by mouth daily.     risperiDONE (RISPERDAL) 2 MG tablet Take 2 mg by mouth at bedtime.     No facility-administered  medications prior to visit.    No Known Allergies  Review of Systems A fourteen system review of systems was performed and found to be positive as per HPI.    Objective:    Physical Exam General:  Well Developed, well nourished, appropriate for stated age.  Neuro:  Alert and oriented,  extra-ocular muscles intact  HEENT:  Normocephalic, atraumatic, neck supple Skin:  no gross rash, warm, pink. Cardiac:  RRR, S1 S2 Respiratory: Not using accessory muscles, speaking in full sentences- unlabored. GU/rectal: Pt deferred exam. Vascular:  Ext warm, no cyanosis apprec.; cap RF less 2 sec. Psych:  No HI/SI, judgement and insight good, Euthymic mood. Full Affect.  BP 98/63   Pulse 64   Temp 97.8 F (36.6 C)   Ht 5\' 9"  (1.753 m)   Wt 178 lb (80.7 kg)   SpO2 100%   BMI 26.29 kg/m  Wt Readings from Last 3 Encounters:  03/14/21 178 lb (80.7 kg)  03/02/21 176 lb 12.8 oz (80.2 kg)  02/02/21 174 lb (78.9 kg)    Health Maintenance Due  Topic Date Due   COVID-19 Vaccine (1) Never done    HIV Screening  Never done   Hepatitis C Screening  Never done    There are no preventive care reminders to display for this patient.   Lab Results  Component Value Date   TSH 1.890 02/26/2021   Lab Results  Component Value Date   WBC 6.8 10/06/2020   HGB 15.0 10/06/2020   HCT 44.8 10/06/2020   MCV 87 10/06/2020   PLT 176 10/06/2020   Lab Results  Component Value Date   NA 139 02/26/2021   K 4.5 02/26/2021   CO2 22 02/26/2021   GLUCOSE 85 02/26/2021   BUN 8 02/26/2021   CREATININE 0.95 02/26/2021   BILITOT 0.6 02/26/2021   ALKPHOS 57 02/26/2021   AST 21 02/26/2021   ALT 14 02/26/2021   PROT 7.1 02/26/2021   ALBUMIN 4.6 02/26/2021   CALCIUM 9.6 02/26/2021   ANIONGAP 9 04/06/2020   EGFR 98 02/26/2021   Lab Results  Component Value Date   CHOL 164 02/26/2021   Lab Results  Component Value Date   HDL 42 02/26/2021   Lab Results  Component Value Date   LDLCALC 112 (H) 02/26/2021   Lab Results  Component Value Date   TRIG 50 02/26/2021   Lab Results  Component Value Date   CHOLHDL 3.9 02/26/2021   Lab Results  Component Value Date   HGBA1C 5.1 10/06/2020       Assessment & Plan:   Problem List Items Addressed This Visit   None Visit Diagnoses     Hemorrhoids, unspecified hemorrhoid type    -  Primary   Relevant Medications   Lidocaine-Hydrocort, Perianal, 3-0.5 % CREA      Hemorrhoids: -Pt deferred DRE, recommend to monitor for worsening symptoms including signs of thrombosed hemorrhoid. Recommend to use topical lidocaine-hydrocortisone cream as directed for no more than 7 days due to potential adverse effects with extended use. If symptoms fail to improve or worsen recommend referral to gastro/gen sx. Patient verbalized understanding.   Meds ordered this encounter  Medications   Lidocaine-Hydrocort, Perianal, 3-0.5 % CREA    Sig: Apply to anal area as needed up to two times daily. Max use is 7 days.    Dispense:  7 g    Refill:  0    Order  Specific Question:   Supervising Provider  Answer:   Beatrice Lecher D [[id number]]     Lorrene Reid, PA-C

## 2021-04-02 ENCOUNTER — Ambulatory Visit (INDEPENDENT_AMBULATORY_CARE_PROVIDER_SITE_OTHER): Payer: Medicare Other | Admitting: Physician Assistant

## 2021-04-02 ENCOUNTER — Other Ambulatory Visit: Payer: Self-pay

## 2021-04-02 ENCOUNTER — Encounter: Payer: Self-pay | Admitting: Physician Assistant

## 2021-04-02 VITALS — BP 118/78 | HR 68 | Temp 97.8°F | Ht 69.0 in | Wt 177.3 lb

## 2021-04-02 DIAGNOSIS — R55 Syncope and collapse: Secondary | ICD-10-CM | POA: Diagnosis not present

## 2021-04-02 NOTE — Patient Instructions (Signed)
Syncope, Adult Syncope is when you pass out or faint for a short time. It is caused by a sudden decrease in blood flow to the brain. This can happen for many reasons. It can sometimes happen when seeing blood, getting a shot (injection), or having pain or strong emotions. Most causes of fainting are not dangerous, but in some cases it can be a sign of a serious medical problem. If you faint, get help right away. Call your local emergency services (911 in the [place] Follow these instructions at home: Watch for any changes in your symptoms. Take these actions to stay safe and help with your symptoms: Knowing when you may be about to faint Signs that you may be about to faint include: Feeling dizzy or light-headed. It may feel like the room is spinning. Feeling weak. Feeling like you may vomit (nauseous). Seeing spots or seeing all white or all black. Having cold, clammy skin. Feeling warm and sweaty. Hearing ringing in the ears. If you start to feel like you might faint, sit or lie down right away. If sitting, lower your head down between your legs. If lying down, raise (elevate) your feet above the level of your heart. Breathe deeply and steadily. Wait until all of the symptoms are gone. Have someone stay with you until you feel better. Medicines Take over-the-counter and prescription medicines only as told by your doctor. If you are taking blood pressure or heart medicine, sit up and stand up slowly. Spend a few minutes getting ready to sit and then stand. This can help you feel less dizzy. Lifestyle Do not drive, use machinery, or play sports until your doctor says it is okay. Do not drink alcohol. Do not smoke or use any products that contain nicotine or tobacco. If you need help quitting, ask your doctor. Avoid hot tubs and saunas. General instructions Talk with your doctor about your symptoms. You may need to have testing to help find the cause. Drink enough fluid to keep your pee  (urine) pale yellow. Avoid standing for a long time. If you must stand for a long time, do movements such as: Moving your legs. Crossing your legs. Flexing and stretching your leg muscles. Squatting. Keep all follow-up visits. Contact a doctor if: You have episodes of near fainting. Get help right away if: You pass out or faint. You hit your head or are injured after fainting. You have any of these symptoms: Fast or uneven heartbeats (palpitations). Pain in your chest, belly, or back. Shortness of breath. You have jerky movements that you cannot control (seizure). You have a very bad headache. You are confused. You have problems with how you see (vision). You are very weak. You have trouble walking. You are bleeding from your mouth or your butt (rectum). You have black or tarry poop (stool). These symptoms may be an emergency. Get help right away. Call your local emergency services (911 in the [place] Do not wait to see if the symptoms will go away. Do not drive yourself to the hospital. Summary Syncope is when you pass out or faint for a short time. It is caused by a sudden decrease in blood flow to the brain. Signs that you may be about to faint include feeling dizzy or light-headed, feeling like you may vomit, seeing all white or all black, or having cold, clammy skin. If you start to feel like you might faint, sit or lie down right away. Lower your head if sitting, or raise (elevate) your  feet if lying down. Breathe deeply and steadily. Wait until all of the symptoms are gone. This information is not intended to replace advice given to you by your health care provider. Make sure you discuss any questions you have with your health care provider. Document Revised: 09/21/2020 Document Reviewed: 09/21/2020 Elsevier Patient Education  2022 [employer].

## 2021-04-02 NOTE — Progress Notes (Signed)
Acute Office Visit  Subjective:    Patient ID: Eric Hopkins, male    DOB: 1971/05/17, 50 y.o.   MRN: [id number]  Chief Complaint  Patient presents with   Acute Visit    Syncope     HPI Patient is in today for syncopal episode (Friday night- 2 days ago). Patient reports was standing in the bedroom and was talking to his wife, stopped talking and had a dead stare on his face and then passed out. Denies slurred speech, facial or arm numbness before episode. Denies head injury, chest pain, palpitations, nausea, vomiting, diarrhea or shortness of breath. States has notice for about the past 1-2 weeks feeling lightheaded and dizzy when standing up from a sitting position. Patient reports newest medication was alfuzosin, which he started about 3 months ago and since his syncopal episode has stopped the medication. Denies starting other new medications or supplements. States after syncope episode had a severe panic attack.    Past Medical History:  Diagnosis Date   Anxiety    Depression    GERD (gastroesophageal reflux disease)     Past Surgical History:  Procedure Laterality Date   CARPAL TUNNEL RELEASE Right    COLONOSCOPY     >10 yrs ago.   TONSILLECTOMY AND ADENOIDECTOMY      Family History  Problem Relation Age of Onset   Hypertension Mother    Anxiety disorder Mother    Depression Mother    Heart attack Father 69       Died in his sleep age 14   Depression Maternal Grandmother    Anxiety disorder Maternal Grandmother    Hypertension Maternal Grandmother    Alcohol abuse Maternal Grandfather    Colon cancer Neg Hx    Colon polyps Neg Hx    Esophageal cancer Neg Hx    Rectal cancer Neg Hx    Stomach cancer Neg Hx     Social History   Socioeconomic History   Marital status: Significant Other    Spouse name: Not on file   Number of children: Not on file   Years of education: Not on file   Highest education level: Not on file  Occupational History   Not on  file  Tobacco Use   Smoking status: Former    Packs/day: 0.50    Years: 4.00    Pack years: 2.00    Types: Cigarettes    Quit date: 05/28/1995    Years since quitting: 25.8   Smokeless tobacco: Never  Vaping Use   Vaping Use: Never used  Substance and Sexual Activity   Alcohol use: Yes    Comment: occassionally   Drug use: Yes    Frequency: 1.0 times per week    Types: Marijuana    Comment: has not in last few months   Sexual activity: Yes    Birth control/protection: None  Other Topics Concern   Not on file  Social History Narrative   Lives with wife and has a 46 year old.    Social Determinants of Health   Financial Resource Strain: Not on file  Food Insecurity: Not on file  Transportation Needs: Not on file  Physical Activity: Not on file  Stress: Not on file  Social Connections: Not on file  Intimate Partner Violence: Not on file    Outpatient Medications Prior to Visit  Medication Sig Dispense Refill   alfuzosin (UROXATRAL) 10 MG 24 hr tablet Take 10 mg by mouth at bedtime.  clonazePAM (KLONOPIN) 0.5 MG tablet Take 0.5 mg by mouth 2 (two) times daily as needed for anxiety.     FLUoxetine HCl 60 MG TABS Take 60 mg by mouth daily.     Lidocaine-Hydrocort, Perianal, 3-0.5 % CREA Apply to anal area as needed up to two times daily. Max use is 7 days. 7 g 0   risperiDONE (RISPERDAL) 2 MG tablet Take 2 mg by mouth at bedtime.     tamsulosin (FLOMAX) 0.4 MG CAPS capsule Take 0.4 mg by mouth at bedtime.     No facility-administered medications prior to visit.    No Known Allergies  Review of Systems Review of Systems:  A fourteen system review of systems was performed and found to be positive as per HPI.    Objective:    Physical Exam General:  Well Developed, well nourished, appropriate for stated age.  Neuro:  Alert and oriented x3, extra-ocular muscles intact, no focal deficits   HEENT:  Normocephalic, atraumatic, PERRL, normal TM's of right ear, cerumen  impaction of left ear, neck supple Skin:  no gross rash, warm, pink. Abdomen: non-distended, non-tender  Cardiac:  RRR, S1 S2 Respiratory:  CTA B/L w/o wheezing, crackles or rales, Not using accessory muscles, speaking in full sentences- unlabored. Vascular:  Ext warm, no cyanosis apprec.; cap RF less 2 sec. No edema  Psych:  No HI/SI, judgement and insight good, appropriate mood. Full Affect.  BP 118/78 (BP Location: Left Arm)   Pulse 68   Temp 97.8 F (36.6 C)   Ht $R'5\' 9"'aZ$  (1.753 m)   Wt 177 lb 4.8 oz (80.4 kg)   SpO2 100%   BMI 26.18 kg/m  Wt Readings from Last 3 Encounters:  04/02/21 177 lb 4.8 oz (80.4 kg)  03/14/21 178 lb (80.7 kg)  03/02/21 176 lb 12.8 oz (80.2 kg)    Health Maintenance Due  Topic Date Due   COVID-19 Vaccine (1) Never done   HIV Screening  Never done   Hepatitis C Screening  Never done    There are no preventive care reminders to display for this patient.   Lab Results  Component Value Date   TSH 1.890 02/26/2021   Lab Results  Component Value Date   WBC 6.9 04/02/2021   HGB 13.5 04/02/2021   HCT 40.8 04/02/2021   MCV 88 04/02/2021   PLT 183 04/02/2021   Lab Results  Component Value Date   NA 137 04/02/2021   K 4.2 04/02/2021   CO2 24 04/02/2021   GLUCOSE 72 04/02/2021   BUN 11 04/02/2021   CREATININE 0.95 04/02/2021   BILITOT 0.4 04/02/2021   ALKPHOS 55 04/02/2021   AST 16 04/02/2021   ALT 16 04/02/2021   PROT 6.9 04/02/2021   ALBUMIN 4.7 04/02/2021   CALCIUM 9.2 04/02/2021   ANIONGAP 9 04/06/2020   EGFR 98 04/02/2021   Lab Results  Component Value Date   CHOL 164 02/26/2021   Lab Results  Component Value Date   HDL 42 02/26/2021   Lab Results  Component Value Date   LDLCALC 112 (H) 02/26/2021   Lab Results  Component Value Date   TRIG 50 02/26/2021   Lab Results  Component Value Date   CHOLHDL 3.9 02/26/2021   Lab Results  Component Value Date   HGBA1C 5.1 10/06/2020       Assessment & Plan:   Problem  List Items Addressed This Visit   None Visit Diagnoses     Syncope, unspecified  syncope type    -  Primary   Relevant Orders   CBC with Differential/Platelet (Completed)   Comprehensive metabolic panel (Completed)   EKG 12-Lead      Discussed with patient various etiologies for syncope including vasovagal, orthostatic, medications, cardiac and neural. EKG obtained: NSR, incomplete right bundle branch block, rate 63 bpm and no acute ST-T wave changes noted. Compared with EKG 05/05/2020 and 04/07/2020, no mention/indication of incomplete RBBB. Patient denies cardiac symptoms, no reoccurrence of syncope, Qtc 431 (<500 ms) so less likely arrhythmia or ACS. Orthostatics obtained, wnl's. Will obtain labs to evaluate electrolytes, renal function, and hemoglobin/hematocrit. Soft blood pressure on intake. Syncope 1% adverse reaction with Uroxatral. Recommend to discontinue Uroxatral and follow up with Urology. If symptoms re-occur recommend referral to cardiology and obtaining imaging studies such as head CT w/o contrast.  No orders of the defined types were placed in this encounter.    Lorrene Reid, PA-C

## 2021-04-03 LAB — COMPREHENSIVE METABOLIC PANEL
ALT: 16 IU/L (ref 0–44)
AST: 16 IU/L (ref 0–40)
Albumin/Globulin Ratio: 2.1 (ref 1.2–2.2)
Albumin: 4.7 g/dL (ref 4.0–5.0)
Alkaline Phosphatase: 55 IU/L (ref 44–121)
BUN/Creatinine Ratio: 12 (ref 9–20)
BUN: 11 mg/dL (ref 6–24)
Bilirubin Total: 0.4 mg/dL (ref 0.0–1.2)
CO2: 24 mmol/L (ref 20–29)
Calcium: 9.2 mg/dL (ref 8.7–10.2)
Chloride: 101 mmol/L (ref 96–106)
Creatinine, Ser: 0.95 mg/dL (ref 0.76–1.27)
Globulin, Total: 2.2 g/dL (ref 1.5–4.5)
Glucose: 72 mg/dL (ref 70–99)
Potassium: 4.2 mmol/L (ref 3.5–5.2)
Sodium: 137 mmol/L (ref 134–144)
Total Protein: 6.9 g/dL (ref 6.0–8.5)
eGFR: 98 mL/min/{1.73_m2} (ref >59)

## 2021-04-03 LAB — CBC WITH DIFFERENTIAL/PLATELET
Basophils Absolute: 0 10*3/uL (ref 0.0–0.2)
Basos: 0 %
EOS (ABSOLUTE): 0.4 10*3/uL (ref 0.0–0.4)
Eos: 5 %
Hematocrit: 40.8 % (ref 37.5–51.0)
Hemoglobin: 13.5 g/dL (ref 13.0–17.7)
Immature Grans (Abs): 0 10*3/uL (ref 0.0–0.1)
Immature Granulocytes: 0 %
Lymphocytes Absolute: 2.7 10*3/uL (ref 0.7–3.1)
Lymphs: 38 %
MCH: 29.2 pg (ref 26.6–33.0)
MCHC: 33.1 g/dL (ref 31.5–35.7)
MCV: 88 fL (ref 79–97)
Monocytes Absolute: 0.5 10*3/uL (ref 0.1–0.9)
Monocytes: 7 %
Neutrophils Absolute: 3.4 10*3/uL (ref 1.4–7.0)
Neutrophils: 50 %
Platelets: 183 10*3/uL (ref 150–450)
RBC: 4.63 x10E6/uL (ref 4.14–5.80)
RDW: 13.2 % (ref 11.6–15.4)
WBC: 6.9 10*3/uL (ref 3.4–10.8)

## 2021-04-04 ENCOUNTER — Encounter: Payer: Self-pay | Admitting: Physician Assistant

## 2021-04-04 LAB — EKG 12-LEAD

## 2021-04-12 ENCOUNTER — Ambulatory Visit (INDEPENDENT_AMBULATORY_CARE_PROVIDER_SITE_OTHER): Payer: Medicare Other | Admitting: Gastroenterology

## 2021-04-12 ENCOUNTER — Encounter: Payer: Self-pay | Admitting: Gastroenterology

## 2021-04-12 VITALS — BP 110/62 | HR 67 | Ht 69.0 in | Wt 179.4 lb

## 2021-04-12 DIAGNOSIS — K649 Unspecified hemorrhoids: Secondary | ICD-10-CM | POA: Diagnosis not present

## 2021-04-12 MED ORDER — HYDROCORTISONE ACETATE 25 MG RE SUPP: 25.0000 mg | Freq: Two times a day (BID) | RECTAL | 0 refills | Status: DC

## 2021-04-12 NOTE — Progress Notes (Signed)
HPI :  50 y/o male here for a follow-up visit to address what he thinks is hemorrhoids.  I met him for a screening colonoscopy in January of this year.  He had 1 small adenoma removed and internal hemorrhoids were noted as well.  He states in recent months he developed a nodule in his rectum.  He states this is irritating to him, and makes it hard to clean himself and he wants to go away.  He states is not painful to palpation.  He has 1-2 bowel movements per day.  States he eats a high-fiber diet, does not strain, denies any constipation.  He is not having any bleeding symptoms routinely.  He states the area feels like its been about the same since he noticed it.  He has tried Preparation H as well as topical ointments and states nothing is really helping it to make it go away.  His uncle had colon cancer.  He inquires about what else can be done.  He denies any perianal pain   Colonoscopy 06/09/2020 - The perianal and digital rectal examinations were normal. - A moderate amount of liquid stool was found in the entire colon on initial entery. Lavage of the colon using copious amounts of sterile water was performed, resulting in clearance with good visualization. - A 3 mm polyp was found in the ascending colon. The polyp was sessile. The polyp was removed with a cold snare. Resection and retrieval were complete. - Internal hemorrhoids were found during retroflexion. - The exam was otherwise without abnormality.     Past Medical History:  Diagnosis Date   Anxiety    Depression    GERD (gastroesophageal reflux disease)      Past Surgical History:  Procedure Laterality Date   CARPAL TUNNEL RELEASE Right    COLONOSCOPY     >10 yrs ago.   TONSILLECTOMY AND ADENOIDECTOMY     Family History  Problem Relation Age of Onset   Hypertension Mother    Anxiety disorder Mother    Depression Mother    Heart attack Father 30       Died in his sleep age 10   Depression Maternal Grandmother     Anxiety disorder Maternal Grandmother    Hypertension Maternal Grandmother    Alcohol abuse Maternal Grandfather    Colon cancer Neg Hx    Colon polyps Neg Hx    Esophageal cancer Neg Hx    Rectal cancer Neg Hx    Stomach cancer Neg Hx    Social History   Tobacco Use   Smoking status: Former    Packs/day: 0.50    Years: 4.00    Pack years: 2.00    Types: Cigarettes    Quit date: 05/28/1995    Years since quitting: 25.8   Smokeless tobacco: Never  Vaping Use   Vaping Use: Never used  Substance Use Topics   Alcohol use: Yes    Comment: occassionally   Drug use: Yes    Frequency: 1.0 times per week    Types: Marijuana    Comment: has not in last few months   Current Outpatient Medications  Medication Sig Dispense Refill   clonazePAM (KLONOPIN) 0.5 MG tablet Take 0.5 mg by mouth 2 (two) times daily as needed for anxiety.     FLUoxetine HCl 60 MG TABS Take 60 mg by mouth daily.     risperiDONE (RISPERDAL) 2 MG tablet Take 2 mg by mouth at bedtime.  No current facility-administered medications for this visit.   No Known Allergies   Review of Systems: All systems reviewed and negative except where noted in HPI.    No results found.  Physical Exam: BP 110/62   Pulse 67   Ht $R'5\' 9"'ZN$  (1.753 m)   Wt 179 lb 6.4 oz (81.4 kg)   SpO2 98%   BMI 26.49 kg/m  Constitutional: Pleasant,well-developed, male in no acute distress. DRE - [place] as standby - protuberant hemorrhoid on R lateral side, no fluctuance or TTP but quite swollen  Extremities: no edema Neurological: Alert and oriented to person place and time. Skin: Skin is warm and dry. No rashes noted. Psychiatric: Normal mood and affect. Behavior is normal.   ASSESSMENT AND PLAN: 50 year old male here for reassessment of following:  Hemorrhoids  Patient with symptomatic swollen/protuberant hemorrhoid in the right lateral side extending from the anal canal externally.  Is not tender, no fluctuance, is not  acting like a thrombosed hemorrhoid given chronicity of symptoms but is a bit hard to palpation.  He did not have this on perianal exam at the time of our colonoscopy.  We discussed hemorrhoids in general, this is not amenable to banding, he needs a surgical evaluation by one of our colorectal surgeons to evaluate this for definitive treatment.  He is agreeable to this.  We will try him on Anusol 1 $Remove'25mg'ldNqoPb$  BID for one week if too expensive, then use 1% hydrocortisone cream pea sized amount PR BID.  He agreed  Jolly Mango, MD [facility]

## 2021-04-12 NOTE — Patient Instructions (Addendum)
If you are age 49 or older, your body mass index should be between 23-30. Your Body mass index is 26.49 kg/m. If this is out of the aforementioned range listed, please consider follow up with your Primary Care Provider.  If you are age 75 or younger, your body mass index should be between 19-25. Your Body mass index is 26.49 kg/m. If this is out of the aformentioned range listed, please consider follow up with your Primary Care Provider.   ________________________________________________________  The  GI providers would like to encourage you to use [facility] to communicate with providers for non-urgent requests or questions.  Due to long hold times on the telephone, sending your provider a message by [facility] may be a faster and more efficient way to get a response.  Please allow 48 business hours for a response.  Please remember that this is for non-urgent requests.  _______________________________________________________   We are referring you to [facility].  They will contact you directly to schedule an appointment.  It may take a week or more before you hear from them.  Please feel free to contact us if you have not heard from them within 2 weeks and we will follow up on the referral.  Their number is [phone].  We have sent the following medications to your pharmacy for you to pick up at your convenience: Anusol: Use twice a day for one week  Thank you for entrusting me with your care and for choosing [employer], Dr. Mapleton Cellar

## 2021-05-14 ENCOUNTER — Telehealth: Payer: Self-pay

## 2021-05-14 NOTE — Telephone Encounter (Signed)
[facility] at [phone]. Patient has an appointment with Dr. Johney Maine on 05-29-21

## 2021-05-29 ENCOUNTER — Ambulatory Visit: Payer: Self-pay | Admitting: Surgery

## 2021-05-29 DIAGNOSIS — K643 Fourth degree hemorrhoids: Secondary | ICD-10-CM | POA: Diagnosis not present

## 2021-05-29 DIAGNOSIS — N138 Other obstructive and reflux uropathy: Secondary | ICD-10-CM | POA: Diagnosis not present

## 2021-05-29 DIAGNOSIS — Z86010 Personal history of colonic polyps: Secondary | ICD-10-CM | POA: Diagnosis not present

## 2021-05-29 DIAGNOSIS — K645 Perianal venous thrombosis: Secondary | ICD-10-CM | POA: Diagnosis not present

## 2021-05-29 DIAGNOSIS — N401 Enlarged prostate with lower urinary tract symptoms: Secondary | ICD-10-CM | POA: Diagnosis not present

## 2021-06-06 ENCOUNTER — Encounter: Payer: Self-pay | Admitting: Physician Assistant

## 2021-06-06 ENCOUNTER — Ambulatory Visit (INDEPENDENT_AMBULATORY_CARE_PROVIDER_SITE_OTHER): Payer: Medicare HMO | Admitting: Physician Assistant

## 2021-06-06 ENCOUNTER — Other Ambulatory Visit: Payer: Self-pay

## 2021-06-06 VITALS — BP 94/62 | HR 82 | Temp 97.9°F | Ht 69.0 in | Wt 175.0 lb

## 2021-06-06 DIAGNOSIS — B029 Zoster without complications: Secondary | ICD-10-CM | POA: Diagnosis not present

## 2021-06-06 MED ORDER — TRIAMCINOLONE ACETONIDE 0.5 % EX OINT: 1.0000 "application " | TOPICAL_OINTMENT | Freq: Two times a day (BID) | CUTANEOUS | 0 refills | Status: DC

## 2021-06-06 MED ORDER — VALACYCLOVIR HCL 1 G PO TABS: 1000.0000 mg | ORAL_TABLET | Freq: Three times a day (TID) | ORAL | 0 refills | Status: AC

## 2021-06-06 NOTE — Patient Instructions (Signed)
Shingles ?Shingles, which is also known as herpes zoster, is an infection that causes a painful skin rash and fluid-filled blisters. It is caused by a virus. ?Shingles only develops in people who: ?Have had chickenpox. ?Have been vaccinated against chickenpox. Shingles is rare in this group. ?What are the causes? ?Shingles is caused by varicella-zoster virus. This is the same virus that causes chickenpox. After a person is exposed to the virus, it stays in the body in an inactive (dormant) state. Shingles develops if the virus is reactivated. This can happen many years after the first (initial) exposure to the virus. It is not known what causes this virus to be reactivated. ?What increases the risk? ?People who have had chickenpox or received the chickenpox vaccine are at risk for shingles. Shingles infection is more common in people who: ?Are older than 50 years of age. ?Have a weakened disease-fighting system (immune system), such as people with: ?HIV (human immunodeficiency virus). ?AIDS (acquired immunodeficiency syndrome). ?Cancer. ?Are taking medicines that weaken the immune system, such as organ transplant medicines. ?Are experiencing a lot of stress. ?What are the signs or symptoms? ?Early symptoms of this condition include itching, tingling, and pain in an area on your skin. Pain may be described as burning, stabbing, or throbbing. ?A few days or weeks after early symptoms start, a painful red rash appears. The rash is usually on one side of the body and has a band-like or belt-like pattern. The rash eventually turns into fluid-filled blisters that break open, change into scabs, and dry up in about 2-3 weeks. ?At any time during the infection, you may also develop: ?A fever. ?Chills. ?A headache. ?Nausea. ?How is this diagnosed? ?This condition is diagnosed with a skin exam. Skin or fluid samples (a culture) may be taken from the blisters before a diagnosis is made. ?How is this treated? ?The rash may last  for several weeks. There is not a specific cure for this condition. Your health care provider may prescribe medicines to help you manage pain, recover more quickly, and avoid long-term problems. Medicines may include: ?Antiviral medicines. ?Anti-inflammatory medicines. ?Pain medicines. ?Anti-itching medicines (antihistamines). ?If the area involved is on your face, you may be referred to a specialist, such as an eye doctor (ophthalmologist) or an ear, nose, and throat (ENT) doctor (otorhinolaryngologist) to help you avoid eye problems, chronic pain, or disability. ?Follow these instructions at home: ?Medicines ?Take over-the-counter and prescription medicines only as told by your health care provider. ?Apply an anti-itch cream or numbing cream to the affected area as told by your health care provider. ?Relieving itching and discomfort ? ?Apply cold, wet cloths (cold compresses) to the area of the rash or blisters as told by your health care provider. ?Cool baths can be soothing. Try adding baking soda or dry oatmeal to the water to reduce itching. Do not bathe in hot water. ?Use calamine lotion as recommended by your health care provider. This is an over-the-counter lotion that helps to relieve itchiness. ?Blister and rash care ?Keep your rash covered with a loose bandage (dressing). Wear loose-fitting clothing to help ease the pain of material rubbing against the rash. ?Wash your hands with soap and water for at least 20 seconds before and after you change your dressing. If soap and water are not available, use hand sanitizer. ?Change your dressing as told by your health care provider. ?Keep your rash and blisters clean by washing the area with mild soap and cool water as told by your health   care provider. ?Check your rash every day for signs of infection. Check for: ?More redness, swelling, or pain. ?Fluid or blood. ?Warmth. ?Pus or a bad smell. ?Do not scratch your rash or pick at your blisters. To help avoid  scratching: ?Keep your fingernails clean and cut short. ?Wear gloves or mittens while you sleep, if scratching is a problem. ?General instructions ?Rest as told by your health care provider. ?Wash your hands often with soap and water for at least 20 seconds. If soap and water are not available, use hand sanitizer. Doing this lowers your chance of getting a bacterial skin infection. ?Before your blisters change into scabs, your shingles infection can cause chickenpox in people who have never had it or have never been vaccinated against it. To prevent this from happening, avoid contact with other people, especially: ?Babies. ?Pregnant women. ?Children who have eczema. ?Older people who have transplants. ?People who have chronic illnesses, such as cancer or AIDS. ?Keep all follow-up visits. This is important. ?How is this prevented? ?Getting vaccinated is the best way to prevent shingles and protect against shingles complications. If you have not been vaccinated, talk with your health care provider about getting the vaccine. ?Where to find more information ?Centers for Disease Control and Prevention: www.cdc.gov ?Contact a health care provider if: ?Your pain is not relieved with prescribed medicines. ?Your pain does not get better after the rash heals. ?You have any of these signs of infection: ?More redness, swelling, or pain around the rash. ?Fluid or blood coming from the rash. ?Warmth coming from your rash. ?Pus or a bad smell coming from the rash. ?A fever. ?Get help right away if: ?The rash is on your face or nose. ?You have facial pain, pain around your eye area, or loss of feeling on one side of your face. ?You have difficulty seeing. ?You have ear pain or have ringing in your ear. ?You have a loss of taste. ?Your condition gets worse. ?Summary ?Shingles, also known as herpes zoster, is an infection that causes a painful skin rash and fluid-filled blisters. ?This condition is diagnosed with a skin exam. Skin or  fluid samples (a culture) may be taken from the blisters. ?Keep your rash covered with a loose bandage (dressing). Wear loose-fitting clothing to help ease the pain of material rubbing against the rash. ?Before your blisters change into scabs, your shingles infection can cause chickenpox in people who have never had it or have never been vaccinated against it. ?This information is not intended to replace advice given to you by your health care provider. Make sure you discuss any questions you have with your health care provider. ?Document Revised: 05/08/2020 Document Reviewed: 05/08/2020 ?Elsevier Patient Education ? 2022 [employer] ? ?

## 2021-06-06 NOTE — Progress Notes (Signed)
Acute Office Visit  Subjective:    Patient ID: Eric Hopkins, male    DOB: [birth year], 51 y.o.   MRN: [id number]  Chief Complaint  Patient presents with   Acute Visit    Rash     HPI Patient is in today for c/o rash on right flank which appeared about 2 weeks ago. States rash tingles and stings. Area is sensitive to water. Rash has also been itchy the past couple of days. Patient denies facial or ear pain, facial numbness, eye pain, or vision changes.   Past Medical History:  Diagnosis Date   Anxiety    Depression    GERD (gastroesophageal reflux disease)     Past Surgical History:  Procedure Laterality Date   CARPAL TUNNEL RELEASE Right    COLONOSCOPY     >10 yrs ago.   TONSILLECTOMY AND ADENOIDECTOMY      Family History  Problem Relation Age of Onset   Hypertension Mother    Anxiety disorder Mother    Depression Mother    Heart attack Father 50       Died in his sleep age 91   Depression Maternal Grandmother    Anxiety disorder Maternal Grandmother    Hypertension Maternal Grandmother    Alcohol abuse Maternal Grandfather    Colon cancer Neg Hx    Colon polyps Neg Hx    Esophageal cancer Neg Hx    Rectal cancer Neg Hx    Stomach cancer Neg Hx     Social History   Socioeconomic History   Marital status: Significant Other    Spouse name: Not on file   Number of children: Not on file   Years of education: Not on file   Highest education level: Not on file  Occupational History   Not on file  Tobacco Use   Smoking status: Former    Packs/day: 0.50    Years: 4.00    Pack years: 2.00    Types: Cigarettes    Quit date: 05/28/1995    Years since quitting: 26.0   Smokeless tobacco: Never  Vaping Use   Vaping Use: Never used  Substance and Sexual Activity   Alcohol use: Yes    Comment: occassionally   Drug use: Yes    Frequency: 1.0 times per week    Types: Marijuana    Comment: has not in last few months   Sexual activity: Yes    Birth  control/protection: None  Other Topics Concern   Not on file  Social History Narrative   Lives with wife and has a 41 year old.    Social Determinants of Health   Financial Resource Strain: Not on file  Food Insecurity: Not on file  Transportation Needs: Not on file  Physical Activity: Not on file  Stress: Not on file  Social Connections: Not on file  Intimate Partner Violence: Not on file    Outpatient Medications Prior to Visit  Medication Sig Dispense Refill   clonazePAM (KLONOPIN) 0.5 MG tablet Take 0.5 mg by mouth 2 (two) times daily as needed for anxiety.     FLUoxetine HCl 60 MG TABS Take 60 mg by mouth daily.     hydrocortisone (ANUSOL-HC) 25 MG suppository Place 1 suppository (25 mg total) rectally 2 (two) times daily. 14 suppository 0   risperiDONE (RISPERDAL) 2 MG tablet Take 2 mg by mouth at bedtime.     silodosin (RAPAFLO) 8 MG CAPS capsule Take 8 mg by mouth daily.  No facility-administered medications prior to visit.    No Known Allergies  Review of Systems Review of Systems:  A fourteen system review of systems was performed and found to be positive as per HPI.    Objective:    Physical Exam General:  Well Developed, well nourished, appropriate for stated age.  Neuro:  Alert and oriented,  extra-ocular muscles intact  HEENT:  Normocephalic, atraumatic, neck supple Skin:  erythematous lesions w/o vesicles along thoracic dermatome  Cardiac:  RRR, S1 S2 Respiratory: CTA B/L  Vascular:  Ext warm, no cyanosis apprec.; cap RF less 2 sec. Psych:  No HI/SI, judgement and insight good, Euthymic mood. Full Affect.  BP 94/62    Pulse 82    Temp 97.9 F (36.6 C)    Ht _0  (1.753 m)    Wt 175 lb (79.4 kg)    SpO2 99%    BMI 25.84 kg/m  Wt Readings from Last 3 Encounters:  06/06/21 175 lb (79.4 kg)  04/12/21 179 lb 6.4 oz (81.4 kg)  04/02/21 177 lb 4.8 oz (80.4 kg)    Health Maintenance Due  Topic Date Due   COVID-19 Vaccine (1) Never done   HIV  Screening  Never done   Hepatitis C Screening  Never done    There are no preventive care reminders to display for this patient.   Lab Results  Component Value Date   TSH 1.890 02/26/2021   Lab Results  Component Value Date   WBC 6.9 04/02/2021   HGB 13.5 04/02/2021   HCT 40.8 04/02/2021   MCV 88 04/02/2021   PLT 183 04/02/2021   Lab Results  Component Value Date   NA 137 04/02/2021   K 4.2 04/02/2021   CO2 24 04/02/2021   GLUCOSE 72 04/02/2021   BUN 11 04/02/2021   CREATININE 0.95 04/02/2021   BILITOT 0.4 04/02/2021   ALKPHOS 55 04/02/2021   AST 16 04/02/2021   ALT 16 04/02/2021   PROT 6.9 04/02/2021   ALBUMIN 4.7 04/02/2021   CALCIUM 9.2 04/02/2021   ANIONGAP 9 04/06/2020   EGFR 98 04/02/2021   Lab Results  Component Value Date   CHOL 164 02/26/2021   Lab Results  Component Value Date   HDL 42 02/26/2021   Lab Results  Component Value Date   LDLCALC 112 (H) 02/26/2021   Lab Results  Component Value Date   TRIG 50 02/26/2021   Lab Results  Component Value Date   CHOLHDL 3.9 02/26/2021   Lab Results  Component Value Date   HGBA1C 5.1 10/06/2020       Assessment & Plan:   Problem List Items Addressed This Visit   None Visit Diagnoses     Herpes zoster without complication    -  Primary   Relevant Medications   valACYclovir (VALTREX) 1000 MG tablet   triamcinolone ointment (KENALOG) 0.5 %      Discussed with patient has s/sx suggestive of herpes zoster (mild) so will start oral antiviral therapy with Valacyclovir 1000 mg q8 hours for 7 days. Offered neuropathic pain relief with Gabapentin or Lyrica, pt declined at this time. Recommend to apply corticosteroid cream to lesions twice daily as needed. Follow-up if symptoms fail to improve or worsen.    Meds ordered this encounter  Medications   valACYclovir (VALTREX) 1000 MG tablet    Sig: Take 1 tablet (1,000 mg total) by mouth every 8 (eight) hours for 7 days.    Dispense:  21 tablet  Refill:  0    Order Specific Question:   Supervising Provider    Answer:   Beatrice Lecher D [[id number]]   triamcinolone ointment (KENALOG) 0.5 %    Sig: Apply 1 application topically 2 (two) times daily.    Dispense:  30 g    Refill:  0    Order Specific Question:   Supervising Provider    Answer:   Beatrice Lecher D [[id number]]     Lorrene Reid, PA-C

## 2021-06-29 DIAGNOSIS — F330 Major depressive disorder, recurrent, mild: Secondary | ICD-10-CM | POA: Diagnosis not present

## 2021-06-29 DIAGNOSIS — F410 Panic disorder [episodic paroxysmal anxiety] without agoraphobia: Secondary | ICD-10-CM | POA: Diagnosis not present

## 2021-06-29 DIAGNOSIS — R69 Illness, unspecified: Secondary | ICD-10-CM | POA: Diagnosis not present

## 2021-07-02 DIAGNOSIS — F330 Major depressive disorder, recurrent, mild: Secondary | ICD-10-CM | POA: Diagnosis not present

## 2021-07-02 DIAGNOSIS — R69 Illness, unspecified: Secondary | ICD-10-CM | POA: Diagnosis not present

## 2021-07-02 DIAGNOSIS — F410 Panic disorder [episodic paroxysmal anxiety] without agoraphobia: Secondary | ICD-10-CM | POA: Diagnosis not present

## 2021-07-10 DIAGNOSIS — Z8249 Family history of ischemic heart disease and other diseases of the circulatory system: Secondary | ICD-10-CM | POA: Diagnosis not present

## 2021-07-10 DIAGNOSIS — F419 Anxiety disorder, unspecified: Secondary | ICD-10-CM | POA: Diagnosis not present

## 2021-07-10 DIAGNOSIS — Z87891 Personal history of nicotine dependence: Secondary | ICD-10-CM | POA: Diagnosis not present

## 2021-07-10 DIAGNOSIS — Z818 Family history of other mental and behavioral disorders: Secondary | ICD-10-CM | POA: Diagnosis not present

## 2021-07-10 DIAGNOSIS — Z7722 Contact with and (suspected) exposure to environmental tobacco smoke (acute) (chronic): Secondary | ICD-10-CM | POA: Diagnosis not present

## 2021-07-10 DIAGNOSIS — F319 Bipolar disorder, unspecified: Secondary | ICD-10-CM | POA: Diagnosis not present

## 2021-07-10 DIAGNOSIS — Z008 Encounter for other general examination: Secondary | ICD-10-CM | POA: Diagnosis not present

## 2021-07-10 DIAGNOSIS — N400 Enlarged prostate without lower urinary tract symptoms: Secondary | ICD-10-CM | POA: Diagnosis not present

## 2021-07-10 DIAGNOSIS — R69 Illness, unspecified: Secondary | ICD-10-CM | POA: Diagnosis not present

## 2021-07-11 DIAGNOSIS — F330 Major depressive disorder, recurrent, mild: Secondary | ICD-10-CM | POA: Diagnosis not present

## 2021-07-11 DIAGNOSIS — F410 Panic disorder [episodic paroxysmal anxiety] without agoraphobia: Secondary | ICD-10-CM | POA: Diagnosis not present

## 2021-07-11 DIAGNOSIS — R69 Illness, unspecified: Secondary | ICD-10-CM | POA: Diagnosis not present

## 2021-07-24 DIAGNOSIS — R69 Illness, unspecified: Secondary | ICD-10-CM | POA: Diagnosis not present

## 2021-07-24 DIAGNOSIS — F410 Panic disorder [episodic paroxysmal anxiety] without agoraphobia: Secondary | ICD-10-CM | POA: Diagnosis not present

## 2021-07-24 DIAGNOSIS — F330 Major depressive disorder, recurrent, mild: Secondary | ICD-10-CM | POA: Diagnosis not present

## 2021-07-27 DIAGNOSIS — R3914 Feeling of incomplete bladder emptying: Secondary | ICD-10-CM | POA: Diagnosis not present

## 2021-07-27 DIAGNOSIS — R350 Frequency of micturition: Secondary | ICD-10-CM | POA: Diagnosis not present

## 2021-07-27 DIAGNOSIS — N401 Enlarged prostate with lower urinary tract symptoms: Secondary | ICD-10-CM | POA: Diagnosis not present

## 2021-08-24 DIAGNOSIS — R3914 Feeling of incomplete bladder emptying: Secondary | ICD-10-CM | POA: Diagnosis not present

## 2021-08-24 DIAGNOSIS — N401 Enlarged prostate with lower urinary tract symptoms: Secondary | ICD-10-CM | POA: Diagnosis not present

## 2021-08-29 ENCOUNTER — Ambulatory Visit (INDEPENDENT_AMBULATORY_CARE_PROVIDER_SITE_OTHER): Payer: Medicare HMO | Admitting: Physician Assistant

## 2021-08-29 ENCOUNTER — Encounter: Payer: Self-pay | Admitting: Physician Assistant

## 2021-08-29 VITALS — BP 96/60 | HR 86 | Temp 98.2°F | Ht 68.5 in | Wt 162.0 lb

## 2021-08-29 DIAGNOSIS — R69 Illness, unspecified: Secondary | ICD-10-CM | POA: Diagnosis not present

## 2021-08-29 DIAGNOSIS — L819 Disorder of pigmentation, unspecified: Secondary | ICD-10-CM | POA: Diagnosis not present

## 2021-08-29 DIAGNOSIS — E785 Hyperlipidemia, unspecified: Secondary | ICD-10-CM | POA: Diagnosis not present

## 2021-08-29 DIAGNOSIS — J309 Allergic rhinitis, unspecified: Secondary | ICD-10-CM | POA: Diagnosis not present

## 2021-08-29 DIAGNOSIS — F419 Anxiety disorder, unspecified: Secondary | ICD-10-CM | POA: Diagnosis not present

## 2021-08-29 DIAGNOSIS — F32A Depression, unspecified: Secondary | ICD-10-CM

## 2021-08-29 NOTE — Progress Notes (Signed)
? ?Established Patient Office Visit ? ?Subjective:  ?Patient ID: Eric Hopkins, male    DOB: 10/19/1970  Age: 51 y.o. MRN: [id number] ? ?CC:  ?Chief Complaint  ?Patient presents with  ? Follow-up  ? Hyperlipidemia  ? ? ?HPI ?Eric Hopkins presents for follow-up on hyperlipidemia and mood. Patient has c/o dark spot on lower lip and runny nose. Reports dark spot has been present for several months, does not recall biting his lip. Runny nose has been chronic but seems to get worse with allergy season. Started taking oral antihistamine (equate brand) about 4 weeks ago. ? ?HLD: Pt trying to manage with diet. Reports follows a low fat diet.  ? ?Mood: Reports sees Dr. Altamese Woodburn, unable to recall facility information. Reports mood has been unchanged. States was started on Wellbutrin XL 150 mg 3 months ago. Continues with Prozac 60 mg, Clonazepam 0.5 mg, Risperidone 2 mg. No SI/HI. ? ?Past Medical History:  ?Diagnosis Date  ? Anxiety   ? Depression   ? GERD (gastroesophageal reflux disease)   ? ? ?Past Surgical History:  ?Procedure Laterality Date  ? CARPAL TUNNEL RELEASE Right   ? COLONOSCOPY    ? >10 yrs ago.  ? TONSILLECTOMY AND ADENOIDECTOMY    ? ? ?Family History  ?Problem Relation Age of Onset  ? Hypertension Mother   ? Anxiety disorder Mother   ? Depression Mother   ? Heart attack Father 50  ?     Died in his sleep age 57  ? Depression Maternal Grandmother   ? Anxiety disorder Maternal Grandmother   ? Hypertension Maternal Grandmother   ? Alcohol abuse Maternal Grandfather   ? Colon cancer Neg Hx   ? Colon polyps Neg Hx   ? Esophageal cancer Neg Hx   ? Rectal cancer Neg Hx   ? Stomach cancer Neg Hx   ? ? ?Social History  ? ?Socioeconomic History  ? Marital status: Significant Other  ?  Spouse name: Not on file  ? Number of children: Not on file  ? Years of education: Not on file  ? Highest education level: Not on file  ?Occupational History  ? Not on file  ?Tobacco Use  ? Smoking status: Former  ?  Packs/day: 0.50  ?   Years: 4.00  ?  Pack years: 2.00  ?  Types: Cigarettes  ?  Quit date: 05/28/1995  ?  Years since quitting: 26.2  ? Smokeless tobacco: Never  ?Vaping Use  ? Vaping Use: Never used  ?Substance and Sexual Activity  ? Alcohol use: Yes  ?  Comment: occassionally  ? Drug use: Yes  ?  Frequency: 1.0 times per week  ?  Types: Marijuana  ?  Comment: has not in last few months  ? Sexual activity: Yes  ?  Birth control/protection: None  ?Other Topics Concern  ? Not on file  ?Social History Narrative  ? Lives with wife and has a 17 year old.   ? ?Social Determinants of Health  ? ?Financial Resource Strain: Not on file  ?Food Insecurity: Not on file  ?Transportation Needs: Not on file  ?Physical Activity: Not on file  ?Stress: Not on file  ?Social Connections: Not on file  ?Intimate Partner Violence: Not on file  ? ? ?Outpatient Medications Prior to Visit  ?Medication Sig Dispense Refill  ? clonazePAM (KLONOPIN) 0.5 MG tablet Take 0.5 mg by mouth 2 (two) times daily as needed for anxiety.    ? FLUoxetine HCl  60 MG TABS Take 60 mg by mouth daily.    ? hydrocortisone (ANUSOL-HC) 25 MG suppository Place 1 suppository (25 mg total) rectally 2 (two) times daily. 14 suppository 0  ? risperiDONE (RISPERDAL) 2 MG tablet Take 2 mg by mouth at bedtime.    ? silodosin (RAPAFLO) 8 MG CAPS capsule Take 8 mg by mouth daily.    ? triamcinolone ointment (KENALOG) 0.5 % Apply 1 application topically 2 (two) times daily. 30 g 0  ? buPROPion (WELLBUTRIN XL) 150 MG 24 hr tablet Take 150 mg by mouth every morning.    ? finasteride (PROSCAR) 5 MG tablet Take 5 mg by mouth daily.    ? ?No facility-administered medications prior to visit.  ? ? ?No Known Allergies ? ?ROS ?Review of Systems ?Review of Systems:  ?A fourteen system review of systems was performed and found to be positive as per HPI. ? ?  ?Objective:  ?  ?Physical Exam ?General:  Cooperative, in no acute distress, appropriate for stated age.  ?Neuro:  Alert and oriented, normal conjunctiva,  pale nasal mucosa, no swelling of turbinates extra-ocular muscles intact  ?HEENT:  Normocephalic, atraumatic, neck supple  ?Skin:  dark pigmented lesion with irregular border at lower lip ?Cardiac:  RRR, S1 S2 ?Respiratory: CTA B/L  ?Vascular:  Ext warm, no cyanosis apprec.; cap RF less 2 sec. ?Psych:  No HI/SI, judgement and insight good, Euthymic mood. Full Affect. ? ?BP 96/60   Pulse 86   Temp 98.2 ?F (36.8 ?C)   Ht 5' 8.5" (1.74 m)   Wt 162 lb (73.5 kg)   SpO2 98%   BMI 24.27 kg/m?  ?Wt Readings from Last 3 Encounters:  ?08/29/21 162 lb (73.5 kg)  ?06/06/21 175 lb (79.4 kg)  ?04/12/21 179 lb 6.4 oz (81.4 kg)  ? ? ? ?Health Maintenance Due  ?Topic Date Due  ? COVID-19 Vaccine (1) Never done  ? HIV Screening  Never done  ? Hepatitis C Screening  Never done  ? ? ?There are no preventive care reminders to display for this patient. ? ?Lab Results  ?Component Value Date  ? TSH 1.890 02/26/2021  ? ?Lab Results  ?Component Value Date  ? WBC 6.9 04/02/2021  ? HGB 13.5 04/02/2021  ? HCT 40.8 04/02/2021  ? MCV 88 04/02/2021  ? PLT 183 04/02/2021  ? ?Lab Results  ?Component Value Date  ? NA 137 04/02/2021  ? K 4.2 04/02/2021  ? CO2 24 04/02/2021  ? GLUCOSE 72 04/02/2021  ? BUN 11 04/02/2021  ? CREATININE 0.95 04/02/2021  ? BILITOT 0.4 04/02/2021  ? ALKPHOS 55 04/02/2021  ? AST 16 04/02/2021  ? ALT 16 04/02/2021  ? PROT 6.9 04/02/2021  ? ALBUMIN 4.7 04/02/2021  ? CALCIUM 9.2 04/02/2021  ? ANIONGAP 9 04/06/2020  ? EGFR 98 04/02/2021  ? ?Lab Results  ?Component Value Date  ? CHOL 164 02/26/2021  ? ?Lab Results  ?Component Value Date  ? HDL 42 02/26/2021  ? ?Lab Results  ?Component Value Date  ? LDLCALC 112 (H) 02/26/2021  ? ?Lab Results  ?Component Value Date  ? TRIG 50 02/26/2021  ? ?Lab Results  ?Component Value Date  ? CHOLHDL 3.9 02/26/2021  ? ?Lab Results  ?Component Value Date  ? HGBA1C 5.1 10/06/2020  ? ? ?  08/29/2021  ?  8:13 AM 06/06/2021  ?  8:18 AM 03/14/2021  ? 10:11 AM 03/02/2021  ?  8:47 AM 11/29/2020  ?  1:21  PM  ?  Depression screen PHQ 2/9  ?Decreased Interest 0 1 0 0 0  ?Down, Depressed, Hopeless 0 1 0 1 1  ?PHQ - 2 Score 0 2 0 1 1  ?Altered sleeping 0 1 0 0 0  ?Tired, decreased energy 0 1 0 0 1  ?Change in appetite 0 0 0 0 0  ?Feeling bad or failure about yourself  0 0 0 1 1  ?Trouble concentrating 0 0 0 0 0  ?Moving slowly or fidgety/restless 0 0 0 0 0  ?Suicidal thoughts 0 0 0 0 0  ?PHQ-9 Score 0 4 0 2 3  ?Difficult doing work/chores Not difficult at all Somewhat difficult  Somewhat difficult   ? ? ?  08/29/2021  ?  8:14 AM 06/06/2021  ?  8:19 AM 03/14/2021  ? 10:12 AM 03/02/2021  ?  8:48 AM  ?GAD 7 : Generalized Anxiety Score  ?Nervous, Anxious, on Edge 1 0 0 1  ?Control/stop worrying 3 1 0 1  ?Worry too much - different things 3 1 0 1  ?Trouble relaxing 1 0 0 1  ?Restless 1 0 0 1  ?Easily annoyed or irritable 0 0 0 0  ?Afraid - awful might happen 1 0 0 0  ?Total GAD 7 Score 10 2 0 5  ?Anxiety Difficulty Somewhat difficult Somewhat difficult Not difficult at all Somewhat difficult  ? ? ? ?  ?Assessment & Plan:  ? ?Problem List Items Addressed This Visit   ? ?  ? Other  ? Anxiety and depression  ?  -Continue to follow-up with Psychiatry. ?-PHQ-9 score of 0. ? ?  ?  ? Relevant Medications  ? buPROPion (WELLBUTRIN XL) 150 MG 24 hr tablet  ? Dyslipidemia - Primary  ?  -Last lipid panel: HDL 42, LDL 112 ?-Will repeat lipid panel today. Recommend to continue with low fat diet. The 10-year ASCVD risk score (Arnett DK, et al., 2019) is: 1.7% ?-Pending lab results will make treatment adjustments if indicated. ?  ?  ? Relevant Orders  ? CBC w/Diff  ? Comp Met (CMET)  ? Lipid Profile  ? ?Other Visit Diagnoses   ? ? Atypical pigmented lesion      ? Relevant Orders  ? Ambulatory referral to Dermatology  ? Allergic rhinitis, unspecified seasonality, unspecified trigger      ? ?  ? ?Atypical pigmented lesion: ?-Will place dermatology referral for further evaluation. Pt prefers [place] location. ? ?Allergic rhinitis, unspecified  seasonality, unspecified trigger: ?-Recommend to continue oral antihistamine and advised to change to a different one such as Zyrtec, Xyzal or Claritin if current one isn't helping. Also recommend nasal spray su

## 2021-08-29 NOTE — Patient Instructions (Signed)
Allergic Rhinitis, Adult ?Allergic rhinitis is an allergic reaction that affects the mucous membrane inside the nose. The mucous membrane is the tissue that produces mucus. ?There are two types of allergic rhinitis: ?Seasonal. This type is also called hay fever and happens only during certain seasons. ?Perennial. This type can happen at any time of the year. ?Allergic rhinitis cannot be spread from person to person. This condition can be mild, moderate, or severe. It can develop at any age and may be outgrown. ?What are the causes? ?This condition is caused by allergens. These are things that can cause an allergic reaction. Allergens may differ for seasonal allergic rhinitis and perennial allergic rhinitis. ?Seasonal allergic rhinitis is triggered by pollen. Pollen can come from grasses, trees, and weeds. ?Perennial allergic rhinitis may be triggered by: ?Dust mites. ?Proteins in a pet's urine, saliva, or dander. Dander is dead skin cells from a pet. ?Smoke, mold, or car fumes. ?What increases the risk? ?You are more likely to develop this condition if you have a family history of allergies or other conditions related to allergies, including: ?Allergic conjunctivitis. This is inflammation of parts of the eyes and eyelids. ?Asthma. This condition affects the lungs and makes it hard to breathe. ?Atopic dermatitis or eczema. This is long term (chronic) inflammation of the skin. ?Food allergies. ?What are the signs or symptoms? ?Symptoms of this condition include: ?Sneezing or coughing. ?A stuffy nose (nasal congestion), itchy nose, or nasal discharge. ?Itchy eyes and tearing of the eyes. ?A feeling of mucus dripping down the back of your throat (postnasal drip). ?Trouble sleeping. ?Tiredness or fatigue. ?Headache. ?Sore throat. ?How is this diagnosed? ?This condition may be diagnosed with your symptoms, medical history, and physical exam. Your health care provider may check for related conditions, such  as: ?Asthma. ?Pink eye. This is eye inflammation caused by infection (conjunctivitis). ?Ear infection. ?Upper respiratory infection. This is an infection in the nose, throat, or upper airways. ?You may also have tests to find out which allergens trigger your symptoms. These may include skin tests or blood tests. ?How is this treated? ?There is no cure for this condition, but treatment can help control symptoms. Treatment may include: ?Taking medicines that block allergy symptoms, such as corticosteroids and antihistamines. Medicine may be given as a shot, nasal spray, or pill. ?Avoiding any allergens. ?Being exposed again and again to tiny amounts of allergens to help you build a defense against allergens (immunotherapy). This is done if other treatments have not helped. It may include: ?Allergy shots. These are injected medicines that have small amounts of allergen in them. ?Sublingual immunotherapy. This involves taking small doses of a medicine with allergen in it under your tongue. ?If these treatments do not work, your health care provider may prescribe newer, stronger medicines. ?Follow these instructions at home: ?Avoiding allergens ?Find out what you are allergic to and avoid those allergens. These are some things you can do to help avoid allergens: ?If you have perennial allergies: ?Replace carpet with wood, tile, or vinyl flooring. Carpet can trap dander and dust. ?Do not smoke. Do not allow smoking in your home. ?Change your heating and air conditioning filters at least once a month. ?If you have seasonal allergies, take these steps during allergy season: ?Keep windows closed as much as possible. ?Plan outdoor activities when pollen counts are lowest. Check pollen counts before you plan outdoor activities. ?When coming indoors, change clothing and shower before sitting on furniture or bedding. ?If you have a pet in  the house that produces allergens: ?Keep the pet out of the bedroom. ?Vacuum, sweep, and  dust regularly. ?General instructions ?Take over-the-counter and prescription medicines only as told by your health care provider. ?Drink enough fluid to keep your urine pale yellow. ?Keep all follow-up visits as told by your health care provider. This is important. ?Where to find more information ?American Academy of Allergy, Asthma & Immunology: www.aaaai.org ?Contact a health care provider if: ?You have a fever. ?You develop a cough that does not go away. ?You make whistling sounds when you breathe (wheeze). ?Your symptoms slow you down or stop you from doing your normal activities each day. ?Get help right away if: ?You have shortness of breath. ?This symptom may represent a serious problem that is an emergency. Do not wait to see if the symptom will go away. Get medical help right away. Call your local emergency services (911 in the [place] Do not drive yourself to the hospital. ?Summary ?Allergic rhinitis may be managed by taking medicines as directed and avoiding allergens. ?If you have seasonal allergies, keep windows closed as much as possible during allergy season. ?Contact your health care provider if you develop a fever or a cough that does not go away. ?This information is not intended to replace advice given to you by your health care provider. Make sure you discuss any questions you have with your health care provider. ?Document Revised: 07/02/2019 Document Reviewed: 05/11/2019 ?Elsevier Patient Education ? [place]. ? ?

## 2021-08-29 NOTE — Assessment & Plan Note (Signed)
-  Last lipid panel: HDL 42, LDL 112 ?-Will repeat lipid panel today. Recommend to continue with low fat diet. The 10-year ASCVD risk score (Arnett DK, et al., 2019) is: 1.7% ?-Pending lab results will make treatment adjustments if indicated. ?

## 2021-08-29 NOTE — Assessment & Plan Note (Signed)
-  Continue to follow-up with Psychiatry. ?-PHQ-9 score of 0. ? ?

## 2021-08-30 LAB — COMPREHENSIVE METABOLIC PANEL
ALT: 31 IU/L (ref 0–44)
AST: 25 IU/L (ref 0–40)
Albumin/Globulin Ratio: 2.4 — ABNORMAL HIGH (ref 1.2–2.2)
Albumin: 4.8 g/dL (ref 4.0–5.0)
Alkaline Phosphatase: 67 IU/L (ref 44–121)
BUN/Creatinine Ratio: 14 (ref 9–20)
BUN: 13 mg/dL (ref 6–24)
Bilirubin Total: 0.3 mg/dL (ref 0.0–1.2)
CO2: 24 mmol/L (ref 20–29)
Calcium: 9.8 mg/dL (ref 8.7–10.2)
Chloride: 104 mmol/L (ref 96–106)
Creatinine, Ser: 0.91 mg/dL (ref 0.76–1.27)
Globulin, Total: 2 g/dL (ref 1.5–4.5)
Glucose: 72 mg/dL (ref 70–99)
Potassium: 5 mmol/L (ref 3.5–5.2)
Sodium: 142 mmol/L (ref 134–144)
Total Protein: 6.8 g/dL (ref 6.0–8.5)
eGFR: 103 mL/min/{1.73_m2} (ref >59)

## 2021-08-30 LAB — CBC WITH DIFFERENTIAL/PLATELET
Basophils Absolute: 0 10*3/uL (ref 0.0–0.2)
Basos: 1 %
EOS (ABSOLUTE): 0.3 10*3/uL (ref 0.0–0.4)
Eos: 5 %
Hematocrit: 40.6 % (ref 37.5–51.0)
Hemoglobin: 14.1 g/dL (ref 13.0–17.7)
Immature Grans (Abs): 0 10*3/uL (ref 0.0–0.1)
Immature Granulocytes: 0 %
Lymphocytes Absolute: 1.6 10*3/uL (ref 0.7–3.1)
Lymphs: 31 %
MCH: 30.8 pg (ref 26.6–33.0)
MCHC: 34.7 g/dL (ref 31.5–35.7)
MCV: 89 fL (ref 79–97)
Monocytes Absolute: 0.5 10*3/uL (ref 0.1–0.9)
Monocytes: 9 %
Neutrophils Absolute: 2.9 10*3/uL (ref 1.4–7.0)
Neutrophils: 54 %
Platelets: 197 10*3/uL (ref 150–450)
RBC: 4.58 x10E6/uL (ref 4.14–5.80)
RDW: 12.6 % (ref 11.6–15.4)
WBC: 5.2 10*3/uL (ref 3.4–10.8)

## 2021-08-30 LAB — LIPID PANEL
Chol/HDL Ratio: 2.6 ratio (ref 0.0–5.0)
Cholesterol, Total: 155 mg/dL (ref 100–199)
HDL: 59 mg/dL (ref >39)
LDL Chol Calc (NIH): 86 mg/dL (ref 0–99)
Triglycerides: 48 mg/dL (ref 0–149)
VLDL Cholesterol Cal: 10 mg/dL (ref 5–40)

## 2021-08-30 NOTE — Telephone Encounter (Signed)
Patient was contacted via phone for labs and about [employer].  ?

## 2021-08-31 ENCOUNTER — Ambulatory Visit: Payer: Medicare Other | Admitting: Physician Assistant

## 2021-09-13 DIAGNOSIS — L568 Other specified acute skin changes due to ultraviolet radiation: Secondary | ICD-10-CM | POA: Diagnosis not present

## 2021-09-13 DIAGNOSIS — D485 Neoplasm of uncertain behavior of skin: Secondary | ICD-10-CM | POA: Diagnosis not present

## 2021-09-18 DIAGNOSIS — M25511 Pain in right shoulder: Secondary | ICD-10-CM | POA: Diagnosis not present

## 2021-10-02 DIAGNOSIS — F410 Panic disorder [episodic paroxysmal anxiety] without agoraphobia: Secondary | ICD-10-CM | POA: Diagnosis not present

## 2021-10-02 DIAGNOSIS — F330 Major depressive disorder, recurrent, mild: Secondary | ICD-10-CM | POA: Diagnosis not present

## 2021-10-02 DIAGNOSIS — R69 Illness, unspecified: Secondary | ICD-10-CM | POA: Diagnosis not present

## 2021-11-16 DIAGNOSIS — N401 Enlarged prostate with lower urinary tract symptoms: Secondary | ICD-10-CM | POA: Diagnosis not present

## 2021-11-16 DIAGNOSIS — R3914 Feeling of incomplete bladder emptying: Secondary | ICD-10-CM | POA: Diagnosis not present

## 2021-11-16 DIAGNOSIS — R350 Frequency of micturition: Secondary | ICD-10-CM | POA: Diagnosis not present

## 2021-11-21 DIAGNOSIS — F332 Major depressive disorder, recurrent severe without psychotic features: Secondary | ICD-10-CM | POA: Diagnosis not present

## 2021-11-21 DIAGNOSIS — F411 Generalized anxiety disorder: Secondary | ICD-10-CM | POA: Diagnosis not present

## 2021-11-21 DIAGNOSIS — R69 Illness, unspecified: Secondary | ICD-10-CM | POA: Diagnosis not present

## 2021-11-23 DIAGNOSIS — F330 Major depressive disorder, recurrent, mild: Secondary | ICD-10-CM | POA: Diagnosis not present

## 2021-11-23 DIAGNOSIS — R69 Illness, unspecified: Secondary | ICD-10-CM | POA: Diagnosis not present

## 2021-11-23 DIAGNOSIS — F410 Panic disorder [episodic paroxysmal anxiety] without agoraphobia: Secondary | ICD-10-CM | POA: Diagnosis not present

## 2021-11-26 DIAGNOSIS — F330 Major depressive disorder, recurrent, mild: Secondary | ICD-10-CM | POA: Diagnosis not present

## 2021-11-26 DIAGNOSIS — R69 Illness, unspecified: Secondary | ICD-10-CM | POA: Diagnosis not present

## 2021-11-26 DIAGNOSIS — F410 Panic disorder [episodic paroxysmal anxiety] without agoraphobia: Secondary | ICD-10-CM | POA: Diagnosis not present

## 2021-11-28 ENCOUNTER — Encounter: Payer: Self-pay | Admitting: Physician Assistant

## 2021-11-28 DIAGNOSIS — F411 Generalized anxiety disorder: Secondary | ICD-10-CM | POA: Diagnosis not present

## 2021-11-28 DIAGNOSIS — F332 Major depressive disorder, recurrent severe without psychotic features: Secondary | ICD-10-CM | POA: Diagnosis not present

## 2021-11-28 DIAGNOSIS — R69 Illness, unspecified: Secondary | ICD-10-CM | POA: Diagnosis not present

## 2021-11-30 DIAGNOSIS — F330 Major depressive disorder, recurrent, mild: Secondary | ICD-10-CM | POA: Diagnosis not present

## 2021-11-30 DIAGNOSIS — R69 Illness, unspecified: Secondary | ICD-10-CM | POA: Diagnosis not present

## 2021-11-30 DIAGNOSIS — F410 Panic disorder [episodic paroxysmal anxiety] without agoraphobia: Secondary | ICD-10-CM | POA: Diagnosis not present

## 2021-12-04 DIAGNOSIS — F411 Generalized anxiety disorder: Secondary | ICD-10-CM | POA: Diagnosis not present

## 2021-12-04 DIAGNOSIS — R69 Illness, unspecified: Secondary | ICD-10-CM | POA: Diagnosis not present

## 2021-12-04 DIAGNOSIS — F332 Major depressive disorder, recurrent severe without psychotic features: Secondary | ICD-10-CM | POA: Diagnosis not present

## 2021-12-11 DIAGNOSIS — R5383 Other fatigue: Secondary | ICD-10-CM | POA: Insufficient documentation

## 2021-12-11 NOTE — Patient Instructions (Incomplete)
Major Depressive Disorder, Adult Major depressive disorder is a mental health condition. This disorder affects feelings. It can also affect the body. Symptoms of this condition last most of the day, almost every day, for 2 weeks. This disorder can affect: Relationships. Daily activities, such as work and school. Activities that you normally like to do. What are the causes? The cause of this condition is not known. The disorder is likely caused by a mix of things, including: Your personality, such as being a shy person. Your behavior, or how you act toward others. Your thoughts and feelings. Too much alcohol or drugs. How you react to stress. Health and mental problems that you have had for a long time. Things that hurt you in the past (trauma). Big changes in your life, such as divorce. What increases the risk? The following factors may make you more likely to develop this condition: Having family members with depression. Being a woman. Problems in the family. Low levels of some brain chemicals. Things that caused you pain as a child, especially if you lost a parent or were abused. A lot of stress in your life, such as from: Living without basic needs of life, such as food and shelter. Being treated poorly because of race, sex, or religion (discrimination). Health and mental problems that you have had for a long time. What are the signs or symptoms? The main symptoms of this condition are: Being sad all the time. Being grouchy all the time. Loss of interest in things and activities. Other symptoms include: Sleeping too much or too little. Eating too much or too little. Gaining or losing weight, without knowing why. Feeling tired or having low energy. Being restless and weak. Feeling hopeless, worthless, or guilty. Trouble thinking clearly or making decisions. Thoughts of hurting yourself or others, or thoughts of ending your life. Spending a lot of time alone. Inability to  complete common tasks of daily life. If you have very bad MDD, you may: Believe things that are not true. Hear, see, taste, or feel things that are not there. Have mild depression that lasts for at least 2 years. Feel very sad and hopeless. Have trouble speaking or moving. How is this treated? This condition may be treated with: Talk therapy. This teaches you to know bad thoughts, feelings, and actions and how to change them. This can also help you to communicate with others. This can be done with members of your family. Medicines. These can be used to treat worry (anxiety), depression, or low levels of chemicals in the brain. Lifestyle changes. You may need to: Limit alcohol use. Limit drug use. Get regular exercise. Get plenty of sleep. Make healthy eating choices. Spend more time outdoors. Brain stimulation. This treatment excites the brain. This is done when symptoms are very bad or have not gotten better with other treatments. Follow these instructions at home: Activity Get regular exercise as told. Spend time outdoors as told. Make time to do the things you enjoy. Find ways to deal with stress. Try to: Meditate. Do deep breathing. Spend time in nature. Keep a journal. Return to your normal activities as told by your doctor. Ask your doctor what activities are safe for you. Alcohol and drug use If you drink alcohol: Limit how much you use to: 0-1 drink a day for women. 0-2 drinks a day for men. Be aware of how much alcohol is in your drink. In the [place] one drink equals one 12 oz bottle of beer (355 mL),   one 5 oz glass of wine (148 mL), or one 1 oz glass of hard liquor (44 mL). Talk to your doctor about: Alcohol use. Alcohol can affect some medicines. Any drug use. General instructions  Take over-the-counter and prescription medicines and herbal preparations only as told by your doctor. Eat a healthy diet. Get a lot of sleep. Think about joining a support group.  Your doctor may be able to suggest one. Keep all follow-up visits as told by your doctor. This is important. Where to find more information: National Alliance on Mental Illness: www.nami.org [place] National Institute of Mental Health: www.nimh.nih.gov American Psychiatric Association: www.psychiatry.org/patients-families/ Contact a doctor if: Your symptoms get worse. You get new symptoms. Get help right away if: You hurt yourself. You have serious thoughts about hurting yourself or others. You see, hear, taste, smell, or feel things that are not there. If you ever feel like you may hurt yourself or others, or have thoughts about taking your own life, get help right away. Go to your nearest emergency department or: Call your local emergency services (911 in the [place] Call a suicide crisis helpline, such as the National Suicide Prevention Lifeline at 1-800-273-8255 or 988 in the [place] This is open 24 hours a day in the [place] Text the Crisis Text Line at 741741 (in the [place] Summary Major depressive disorder is a mental health condition. This disorder affects feelings. Symptoms of this condition last most of the day, almost every day, for 2 weeks. The symptoms of this disorder can cause problems with relationships and with daily activities. There are treatments and support for people who get this disorder. You may need more than one type of treatment. Get help right away if you have serious thoughts about hurting yourself or others. This information is not intended to replace advice given to you by your health care provider. Make sure you discuss any questions you have with your health care provider. Document Revised: 12/06/2020 Document Reviewed: 04/24/2019 Elsevier Patient Education  2023 Elsevier Inc.  

## 2021-12-12 ENCOUNTER — Ambulatory Visit (INDEPENDENT_AMBULATORY_CARE_PROVIDER_SITE_OTHER): Payer: Medicare HMO | Admitting: Physician Assistant

## 2021-12-12 ENCOUNTER — Encounter: Payer: Self-pay | Admitting: Physician Assistant

## 2021-12-12 VITALS — BP 99/61 | HR 79 | Temp 97.7°F | Ht 68.5 in | Wt 161.0 lb

## 2021-12-12 DIAGNOSIS — R5383 Other fatigue: Secondary | ICD-10-CM

## 2021-12-12 DIAGNOSIS — E785 Hyperlipidemia, unspecified: Secondary | ICD-10-CM

## 2021-12-12 DIAGNOSIS — F332 Major depressive disorder, recurrent severe without psychotic features: Secondary | ICD-10-CM | POA: Diagnosis not present

## 2021-12-12 DIAGNOSIS — F411 Generalized anxiety disorder: Secondary | ICD-10-CM | POA: Diagnosis not present

## 2021-12-12 DIAGNOSIS — F32A Depression, unspecified: Secondary | ICD-10-CM | POA: Diagnosis not present

## 2021-12-12 DIAGNOSIS — F419 Anxiety disorder, unspecified: Secondary | ICD-10-CM

## 2021-12-12 DIAGNOSIS — R69 Illness, unspecified: Secondary | ICD-10-CM | POA: Diagnosis not present

## 2021-12-12 NOTE — Progress Notes (Signed)
Established patient visit   Patient: Eric Hopkins   DOB: [birth year]   51 y.o. Male  MRN: [id number] Visit Date: 12/12/2021  Chief Complaint  Patient presents with   Depression   Subjective    HPI  Patient presents to discuss fatigue. Patient states feeling run-down which has been going on for a few months, unsure how long. Not sleeping well especially with his nocturia and prostate issues. Told his psychiatrist he was coming in to get checked out. Saw his psychiatry about 3 weeks ago and reports some of his medications were changed. Reports started with a new therapist last week too. Patient denies chest pain, palpitations, shortness of breath, or syncope.       12/12/2021    9:11 AM 08/29/2021    8:13 AM 06/06/2021    8:18 AM 03/14/2021   10:11 AM 03/02/2021    8:47 AM  Depression screen PHQ 2/9  Decreased Interest 1 0 1 0 0  Down, Depressed, Hopeless 1 0 1 0 1  PHQ - 2 Score 2 0 2 0 1  Altered sleeping 3 0 1 0 0  Tired, decreased energy 3 0 1 0 0  Change in appetite 0 0 0 0 0  Feeling bad or failure about yourself  1 0 0 0 1  Trouble concentrating 1 0 0 0 0  Moving slowly or fidgety/restless 0 0 0 0 0  Suicidal thoughts 0 0 0 0 0  PHQ-9 Score 10 0 4 0 2  Difficult doing work/chores Very difficult Not difficult at all Somewhat difficult  Somewhat difficult      12/12/2021    9:12 AM 08/29/2021    8:14 AM 06/06/2021    8:19 AM 03/14/2021   10:12 AM  GAD 7 : Generalized Anxiety Score  Nervous, Anxious, on Edge 1 1 0 0  Control/stop worrying 1 3 1  0  Worry too much - different things 1 3 1  0  Trouble relaxing 0 1 0 0  Restless 1 1 0 0  Easily annoyed or irritable 1 0 0 0  Afraid - awful might happen 0 1 0 0  Total GAD 7 Score 5 10 2  0  Anxiety Difficulty Very difficult Somewhat difficult Somewhat difficult Not difficult at all     Medications: Outpatient Medications Prior to Visit  Medication Sig   buPROPion (WELLBUTRIN XL) 150 MG 24 hr tablet Take 150 mg by mouth  every morning.   clonazePAM (KLONOPIN) 0.5 MG tablet Take 0.5 mg by mouth 2 (two) times daily as needed for anxiety.   finasteride (PROSCAR) 5 MG tablet Take 5 mg by mouth daily.   FLUoxetine HCl 60 MG TABS Take 60 mg by mouth daily.   hydrocortisone (ANUSOL-HC) 25 MG suppository Place 1 suppository (25 mg total) rectally 2 (two) times daily.   risperiDONE (RISPERDAL) 2 MG tablet Take 2 mg by mouth at bedtime.   silodosin (RAPAFLO) 8 MG CAPS capsule Take 8 mg by mouth daily.   triamcinolone ointment (KENALOG) 0.5 % Apply 1 application topically 2 (two) times daily.   No facility-administered medications prior to visit.    Review of Systems Review of Systems:  A fourteen system review of systems was performed and found to be positive as per HPI.  Last CBC Lab Results  Component Value Date   WBC 5.2 08/29/2021   HGB 14.1 08/29/2021   HCT 40.6 08/29/2021   MCV 89 08/29/2021   MCH 30.8 08/29/2021   RDW 12.6 08/29/2021  PLT 197 03/49/1791   Last metabolic panel Lab Results  Component Value Date   GLUCOSE 72 08/29/2021   NA 142 08/29/2021   K 5.0 08/29/2021   CL 104 08/29/2021   CO2 24 08/29/2021   BUN 13 08/29/2021   CREATININE 0.91 08/29/2021   EGFR 103 08/29/2021   CALCIUM 9.8 08/29/2021   PROT 6.8 08/29/2021   ALBUMIN 4.8 08/29/2021   LABGLOB 2.0 08/29/2021   AGRATIO 2.4 (H) 08/29/2021   BILITOT 0.3 08/29/2021   ALKPHOS 67 08/29/2021   AST 25 08/29/2021   ALT 31 08/29/2021   ANIONGAP 9 04/06/2020   Last lipids Lab Results  Component Value Date   CHOL 155 08/29/2021   HDL 59 08/29/2021   LDLCALC 86 08/29/2021   TRIG 48 08/29/2021   CHOLHDL 2.6 08/29/2021   Last hemoglobin A1c Lab Results  Component Value Date   HGBA1C 5.1 10/06/2020   Last thyroid functions Lab Results  Component Value Date   TSH 1.890 02/26/2021   Last vitamin D No results found for: "25OHVITD2", "25OHVITD3", "VD25OH"   Objective    BP 99/61   Pulse 79   Temp 97.7 F (36.5 C)    Ht 5' 8.5" (1.74 m)   Wt 161 lb (73 kg)   SpO2 100%   BMI 24.12 kg/m  BP Readings from Last 3 Encounters:  12/12/21 99/61  08/29/21 96/60  06/06/21 94/62   Wt Readings from Last 3 Encounters:  12/12/21 161 lb (73 kg)  08/29/21 162 lb (73.5 kg)  06/06/21 175 lb (79.4 kg)    Physical Exam  General:  Cooperative, in no acute distress, appropriate for stated age.  Neuro:  Alert and oriented,  extra-ocular muscles intact  HEENT:  Normocephalic, atraumatic, neck supple, no thyromegaly  Skin:  no gross rash, warm, pink. Cardiac:  RRR, S1 S2 Respiratory: CTA B/L  Vascular:  Ext warm, no cyanosis apprec.; cap RF less 2 sec. Psych:  No HI/SI, judgement and insight good, appropriate- mood. Full Affect.   No results found for any visits on 12/12/21.  Assessment & Plan      Problem List Items Addressed This Visit       Other   Anxiety and depression   Dyslipidemia   Relevant Orders   Lipid Profile   Fatigue - Primary   Relevant Orders   CBC w/Diff   Comp Met (CMET)   TSH   Vitamin D (25 hydroxy)   B12 and Folate Panel   ANA w/Reflex if Positive   Fatigue: -Discussed with patient likely multi-factorial with poor sleep and worsening mood. Will collect labs to r/o other etiologies. Discussed with patient if nocturia improves then likely his sleep will improve as well, recommend to continue to follow-up with urology.  Anxiety and depression: -Followed by Psychiatry, Dr. Altamese [place]. -Discussed with patient if labs unremarkable, then recommend discussing with psychiatrist further medication adjustments. Pt verbalized understanding.    Return for as scheduled.        Lorrene Reid, PA-C  [facility] [phone] (phone) [phone] (fax)  [place]

## 2021-12-13 ENCOUNTER — Other Ambulatory Visit: Payer: Self-pay | Admitting: Urology

## 2021-12-14 ENCOUNTER — Encounter: Payer: Self-pay | Admitting: Physician Assistant

## 2021-12-15 LAB — CBC WITH DIFFERENTIAL/PLATELET
Basophils Absolute: 0 10*3/uL (ref 0.0–0.2)
Basos: 1 %
EOS (ABSOLUTE): 0.3 10*3/uL (ref 0.0–0.4)
Eos: 6 %
Hematocrit: 40.9 % (ref 37.5–51.0)
Hemoglobin: 14.1 g/dL (ref 13.0–17.7)
Immature Grans (Abs): 0 10*3/uL (ref 0.0–0.1)
Immature Granulocytes: 0 %
Lymphocytes Absolute: 1.8 10*3/uL (ref 0.7–3.1)
Lymphs: 36 %
MCH: 30.8 pg (ref 26.6–33.0)
MCHC: 34.5 g/dL (ref 31.5–35.7)
MCV: 89 fL (ref 79–97)
Monocytes Absolute: 0.4 10*3/uL (ref 0.1–0.9)
Monocytes: 9 %
Neutrophils Absolute: 2.5 10*3/uL (ref 1.4–7.0)
Neutrophils: 48 %
Platelets: 174 10*3/uL (ref 150–450)
RBC: 4.58 x10E6/uL (ref 4.14–5.80)
RDW: 12.6 % (ref 11.6–15.4)
WBC: 5.1 10*3/uL (ref 3.4–10.8)

## 2021-12-15 LAB — LIPID PANEL
Chol/HDL Ratio: 2.5 ratio (ref 0.0–5.0)
Cholesterol, Total: 157 mg/dL (ref 100–199)
HDL: 63 mg/dL (ref >39)
LDL Chol Calc (NIH): 86 mg/dL (ref 0–99)
Triglycerides: 35 mg/dL (ref 0–149)
VLDL Cholesterol Cal: 8 mg/dL (ref 5–40)

## 2021-12-15 LAB — COMPREHENSIVE METABOLIC PANEL
ALT: 32 IU/L (ref 0–44)
AST: 28 IU/L (ref 0–40)
Albumin/Globulin Ratio: 2.1 (ref 1.2–2.2)
Albumin: 4.9 g/dL (ref 4.1–5.1)
Alkaline Phosphatase: 61 IU/L (ref 44–121)
BUN/Creatinine Ratio: 11 (ref 9–20)
BUN: 11 mg/dL (ref 6–24)
Bilirubin Total: 0.4 mg/dL (ref 0.0–1.2)
CO2: 24 mmol/L (ref 20–29)
Calcium: 9.9 mg/dL (ref 8.7–10.2)
Chloride: 103 mmol/L (ref 96–106)
Creatinine, Ser: 0.98 mg/dL (ref 0.76–1.27)
Globulin, Total: 2.3 g/dL (ref 1.5–4.5)
Glucose: 83 mg/dL (ref 70–99)
Potassium: 5.1 mmol/L (ref 3.5–5.2)
Sodium: 139 mmol/L (ref 134–144)
Total Protein: 7.2 g/dL (ref 6.0–8.5)
eGFR: 95 mL/min/{1.73_m2} (ref >59)

## 2021-12-15 LAB — B12 AND FOLATE PANEL
Folate: 11.7 ng/mL (ref >3.0)
Vitamin B-12: 527 pg/mL (ref 232–1245)

## 2021-12-15 LAB — VITAMIN D 25 HYDROXY (VIT D DEFICIENCY, FRACTURES): Vit D, 25-Hydroxy: 42.4 ng/mL (ref 30.0–100.0)

## 2021-12-15 LAB — TSH: TSH: 1.84 u[IU]/mL (ref 0.450–4.500)

## 2021-12-15 LAB — ANA W/REFLEX IF POSITIVE: Anti Nuclear Antibody (ANA): NEGATIVE

## 2021-12-19 DIAGNOSIS — F332 Major depressive disorder, recurrent severe without psychotic features: Secondary | ICD-10-CM | POA: Diagnosis not present

## 2021-12-19 DIAGNOSIS — F411 Generalized anxiety disorder: Secondary | ICD-10-CM | POA: Diagnosis not present

## 2021-12-19 DIAGNOSIS — R69 Illness, unspecified: Secondary | ICD-10-CM | POA: Diagnosis not present

## 2022-01-09 DIAGNOSIS — F332 Major depressive disorder, recurrent severe without psychotic features: Secondary | ICD-10-CM | POA: Diagnosis not present

## 2022-01-09 DIAGNOSIS — R69 Illness, unspecified: Secondary | ICD-10-CM | POA: Diagnosis not present

## 2022-01-09 DIAGNOSIS — F411 Generalized anxiety disorder: Secondary | ICD-10-CM | POA: Diagnosis not present

## 2022-01-14 ENCOUNTER — Encounter (HOSPITAL_BASED_OUTPATIENT_CLINIC_OR_DEPARTMENT_OTHER): Payer: Self-pay | Admitting: Urology

## 2022-01-14 NOTE — Progress Notes (Addendum)
Addendum:  left message for pt about time change for his surgery if he is able to arrive at 0530 for 0730 start time.  Receive call back from pt,  stated he could come in earlier.  Pt verbalized understanding to arrive at 0530 , NPO after midnight with exception clear liquids until 0430.  Spoke w/ via phone for pre-op interview--- pt Lab needs dos----  no             Lab results------ no COVID test -----patient states asymptomatic no test needed Arrive at ------- 1030 on 01-29-2022 NPO after MN NO Solid Food.  Clear liquids from MN until--- 0930 Med rec completed Medications to take morning of surgery ----- wellbutrin, prozac, rapaflo Diabetic medication ----- n/a Patient instructed no nail polish to be worn day of surgery Patient instructed to bring photo id and insurance card day of surgery Patient aware to have Driver (ride ) / caregiver for 24 hours after surgery -- wife, ronda Patient Special Instructions ----- reviewed RCC and visitor guidelines Pre-Op special Istructions ----- n/a Patient verbalized understanding of instructions that were given at this phone interview. Patient denies shortness of breath, chest pain, fever, cough at this phone interview.

## 2022-01-18 DIAGNOSIS — R350 Frequency of micturition: Secondary | ICD-10-CM | POA: Diagnosis not present

## 2022-01-18 DIAGNOSIS — N401 Enlarged prostate with lower urinary tract symptoms: Secondary | ICD-10-CM | POA: Diagnosis not present

## 2022-01-18 DIAGNOSIS — R3914 Feeling of incomplete bladder emptying: Secondary | ICD-10-CM | POA: Diagnosis not present

## 2022-01-25 NOTE — H&P (Signed)
Patient is a 51 year old white male presents for evaluation of urinary frequency. The patient states over the last year he has noted gradually worsening urinary frequency. Also has symptoms of severe urgency and occasional hesitancy when he starts his urinary stream. Denies any dysuria or hematuria. There is no family history of prostate cancer. Also has nocturia 1-2 times at night. Here for evaluation and management.  Micro urinalysis is clear on urine spun sediment.  Postvoid residual equals 200 cc  -01/26/21-patient with history of BPH and elevated residual. Has been on Uroxatral 10 mg daily. He has noted  Micro urinalysis is clear on urine spun sediment   -07/27/21-patient with history of BPH and elevated residual. Has been recently on silodosin 8 mg daily, he has noted worsening of his urinary frequency. Continues to have postvoid dribbling.  Micro urinalysis is clear on urine  -08/24/21-patient with history of urinary frequency and nocturia and elevated residual as above. Patient has been unresponsive to alpha blockers but remains on silodosin 8 mg daily and recently added finasteride 5 mg daily.Marland Kitchen He had been scheduled for urodynamics but catheter was unable to be inserted at time of the procedure and so it was canceled. He is here for cystoscopy to assess urethra prostate and bladder.  Cystoscopy is performed today and shows: Short prostatic urethra with mild bilobar prostatic hypertrophy but elevated bladder neck and slight protrusion of median lobe into the bladder. Bladder appeared grossly normal except for some mild trabeculation.  -11/16/21-patient with history of urinary frequency nocturia and elevated residual currently managed with silodosin 8 mg daily and recently added finasteride in March 2023. Cystoscopy showed intravesical median lobe. In the interim the patient has seen some improvement in his urinary frequency and nocturia. He continues to have urgency at times. Also slow stream at times.   Micro urinalysis is clear on urine spun sediment  Postvoid residual equals: 315 cc   01/18/2022: Patient has elected to proceed with TURP for definitive management of underlying BPH lower urinary tract symptoms in an effort to discontinue silodosin and finasteride which causes some unwarranted side effects including retrograde ejaculation. He does remain on both medications at this time. He is scheduled for TURP on 9/5. No interval changes in past medical history, prescription medications taken on a daily basis, no interval surgical or procedural intervention. He denies any recent dysuria or gross hematuria, recent treatment for UTI or other infectious process. No interval fevers or chills, nausea/vomiting, chest pain, shortness of breath.     ALLERGIES: None   MEDICATIONS: Finasteride 5 mg tablet 1 tablet PO Daily  Bupropion Hcl  Clonazepam  Fish Oil  Fluoxetine Hcl  Oxybutynin Chloride Er  Risperidone  Silodosin 8 mg capsule 1 capsule PO Daily     GU PSH: Cystoscopy - 08/24/2021     NON-GU PSH: Carpal tunnel surgery Tonsillectomy     GU PMH: BPH w/LUTS - 11/16/2021, - 08/24/2021, - 07/27/2021, - 01/26/2021, - 12/08/2020 Incomplete bladder emptying - 11/16/2021, - 08/24/2021, - 07/27/2021, - 01/26/2021, - 12/08/2020 Urinary Frequency - 11/16/2021, - 07/27/2021, - 01/26/2021    NON-GU PMH: Anxiety Depression GERD    FAMILY HISTORY: Colon Cancer - Aunt Heart Disease - Father stroke - Grandmother   SOCIAL HISTORY: Marital Status: Married Preferred Language: English; Ethnicity: Not Hispanic Or Latino; Race: White Current Smoking Status: Patient smokes.   Tobacco Use Assessment Completed: Used Tobacco in last 30 days? Does not use smokeless tobacco. Does drink.  Does not use drugs. Has not had  a blood transfusion.    REVIEW OF SYSTEMS:    GU Review Male:   Patient reports frequent urination and get up at night to urinate. Patient denies hard to postpone urination, burning/ pain with  urination, leakage of urine, stream starts and stops, trouble starting your stream, have to strain to urinate , erection problems, and penile pain.  Gastrointestinal (Upper):   Patient denies nausea, vomiting, and indigestion/ heartburn.  Gastrointestinal (Lower):   Patient denies diarrhea and constipation.  Constitutional:   Patient denies fever, night sweats, weight loss, and fatigue.  Skin:   Patient denies skin rash/ lesion and itching.  Eyes:   Patient denies blurred vision and double vision.  Ears/ Nose/ Throat:   Patient denies sore throat and sinus problems.  Hematologic/Lymphatic:   Patient denies swollen glands and easy bruising.  Cardiovascular:   Patient denies leg swelling and chest pains.  Respiratory:   Patient denies shortness of breath and cough.  Endocrine:   Patient denies excessive thirst.  Musculoskeletal:   Patient denies back pain and joint pain.  Neurological:   Patient denies headaches and dizziness.  Psychologic:   Patient denies depression and anxiety.   Notes: Updated from previous visit 11/16/2021 with review from patient as noted above.   VITAL SIGNS:      01/18/2022 08:06 AM  Weight 156 lb / 70.76 kg  Height 68 in / 172.72 cm  BP 99/62 mmHg  Heart Rate 80 /min  Temperature 97.1 F / 36.1 C  BMI 23.7 kg/m   MULTI-SYSTEM PHYSICAL EXAMINATION:    Constitutional: Well-nourished. No physical deformities. Normally developed. Good grooming.  Neck: Neck symmetrical, not swollen. Normal tracheal position.  Respiratory: No labored breathing, no use of accessory muscles.   Cardiovascular: Normal temperature, normal extremity pulses, no swelling, no varicosities.  Skin: No paleness, no jaundice, no cyanosis. No lesion, no ulcer, no rash.  Neurologic / Psychiatric: Oriented to time, oriented to place, oriented to person. No depression, no anxiety, no agitation.  Gastrointestinal: No mass, no tenderness, no rigidity, non obese abdomen.  Musculoskeletal: Normal gait  and station of head and neck.     Complexity of Data:  Source Of History:  Patient, Medical Record Summary  Records Review:   Previous Doctor Records, Previous Hospital Records, Previous Patient Records  Urine Test Review:   Urinalysis  Urodynamics Review:   Review Bladder Scan   01/18/22 01/18/22  Urinalysis  Urine Appearance Clear  Clear   Urine Specimen Voided    Urine Color Straw  Straw   Urine Glucose Neg  Neg mg/dL  Urine Bilirubin Neg  Neg mg/dL  Urine Ketones Neg  Neg mg/dL  Urine Specific Gravity <= 1.005  <=1.005   Urine Blood Neg  Neg ery/uL  Urine pH 5.5  5.5   Urine Protein Neg  Neg mg/dL  Urine Urobilinogen 0.2  0.2 mg/dL  Urine Nitrites Neg  Neg   Urine Leukocyte Esterase Neg  Neg leu/uL   PROCEDURES:          Urinalysis - 81003 Dipstick Dipstick Cont'd  Specimen: Voided Bilirubin: Neg  Color: Straw Ketones: Neg  Appearance: Clear Blood: Neg  Specific Gravity: <= 1.005 Protein: Neg  pH: 5.5 Urobilinogen: 0.2  Glucose: Neg Nitrites: Neg    Leukocyte Esterase: Neg         Urinalysis - 81003 Dipstick Dipstick Cont'd  Color: Straw Bilirubin: Neg mg/dL  Appearance: Clear Ketones: Neg mg/dL  Specific Gravity: <=1.005 Blood: Neg ery/uL  pH:  5.5 Protein: Neg mg/dL  Glucose: Neg mg/dL Urobilinogen: 0.2 mg/dL    Nitrites: Neg    Leukocyte Esterase: Neg leu/uL    ASSESSMENT:      ICD-10 Details  1 GU:   BPH w/LUTS - N40.1 Chronic, Stable  2   Urinary Frequency - R35.0 Chronic, Stable  3   Incomplete bladder emptying - R39.14 Chronic, Stable   PLAN:           Orders Labs Urine Culture          Schedule Return Visit/Planned Activity: 3 Months - Follow up MD, Schedule Surgery          Document Letter(s):  Created for Patient: Clinical Summary         Notes:   All questions answered to the best of my ability regarding the upcoming procedure and expected postoperative course with understanding expressed by the patient. Urine culture sent to serve as  a baseline. Patient will proceed with previously scheduled TURP on 9/5.

## 2022-01-29 ENCOUNTER — Ambulatory Visit (HOSPITAL_BASED_OUTPATIENT_CLINIC_OR_DEPARTMENT_OTHER): Payer: Medicare HMO | Admitting: Anesthesiology

## 2022-01-29 ENCOUNTER — Encounter (HOSPITAL_BASED_OUTPATIENT_CLINIC_OR_DEPARTMENT_OTHER): Payer: Self-pay | Admitting: Urology

## 2022-01-29 ENCOUNTER — Encounter (HOSPITAL_BASED_OUTPATIENT_CLINIC_OR_DEPARTMENT_OTHER)
Admission: RE | Disposition: A | Discharge status: Home / Self Care | Payer: Self-pay | Source: Home / Self Care | Attending: Urology

## 2022-01-29 ENCOUNTER — Other Ambulatory Visit: Payer: Self-pay

## 2022-01-29 ENCOUNTER — Ambulatory Visit (HOSPITAL_BASED_OUTPATIENT_CLINIC_OR_DEPARTMENT_OTHER)
Admission: RE | Admit: 2022-01-29 | Discharge: 2022-01-30 | Disposition: A | Discharge status: Home / Self Care | Payer: Medicare HMO | Attending: Urology | Admitting: Urology

## 2022-01-29 DIAGNOSIS — Z01818 Encounter for other preprocedural examination: Secondary | ICD-10-CM

## 2022-01-29 DIAGNOSIS — F32A Depression, unspecified: Secondary | ICD-10-CM | POA: Diagnosis not present

## 2022-01-29 DIAGNOSIS — Z79899 Other long term (current) drug therapy: Secondary | ICD-10-CM | POA: Insufficient documentation

## 2022-01-29 DIAGNOSIS — Z87891 Personal history of nicotine dependence: Secondary | ICD-10-CM | POA: Diagnosis not present

## 2022-01-29 DIAGNOSIS — R350 Frequency of micturition: Secondary | ICD-10-CM | POA: Insufficient documentation

## 2022-01-29 DIAGNOSIS — F419 Anxiety disorder, unspecified: Secondary | ICD-10-CM | POA: Diagnosis not present

## 2022-01-29 DIAGNOSIS — N400 Enlarged prostate without lower urinary tract symptoms: Secondary | ICD-10-CM | POA: Diagnosis present

## 2022-01-29 DIAGNOSIS — N138 Other obstructive and reflux uropathy: Secondary | ICD-10-CM | POA: Insufficient documentation

## 2022-01-29 DIAGNOSIS — N401 Enlarged prostate with lower urinary tract symptoms: Secondary | ICD-10-CM | POA: Insufficient documentation

## 2022-01-29 DIAGNOSIS — R3914 Feeling of incomplete bladder emptying: Secondary | ICD-10-CM | POA: Diagnosis not present

## 2022-01-29 DIAGNOSIS — R69 Illness, unspecified: Secondary | ICD-10-CM | POA: Diagnosis not present

## 2022-01-29 HISTORY — PX: TRANSURETHRAL RESECTION OF PROSTATE: SHX73

## 2022-01-29 HISTORY — DX: Personal history of colonic polyps: Z86.010

## 2022-01-29 HISTORY — DX: Personal history of other specified conditions: Z87.898

## 2022-01-29 HISTORY — DX: Generalized anxiety disorder: F41.1

## 2022-01-29 HISTORY — DX: Retention of urine, unspecified: R33.9

## 2022-01-29 HISTORY — DX: Personal history of adenomatous and serrated colon polyps: Z86.0101

## 2022-01-29 HISTORY — DX: Benign prostatic hyperplasia with lower urinary tract symptoms: N40.1

## 2022-01-29 HISTORY — DX: Unspecified hemorrhoids: K64.9

## 2022-01-29 SURGERY — TURP (TRANSURETHRAL RESECTION OF PROSTATE)
Anesthesia: General | Site: Prostate

## 2022-01-29 MED ORDER — SODIUM CHLORIDE 0.9 % IR SOLN
Status: DC | PRN
  Administered 2022-01-29: 3000 mL
  Administered 2022-01-29: 6000 mL

## 2022-01-29 MED ORDER — GLYCOPYRROLATE PF 0.2 MG/ML IJ SOSY
PREFILLED_SYRINGE | INTRAMUSCULAR | Status: DC | PRN
  Administered 2022-01-29: .1 mg via INTRAVENOUS

## 2022-01-29 MED ORDER — 0.9 % SODIUM CHLORIDE (POUR BTL) OPTIME
TOPICAL | Status: DC | PRN
  Administered 2022-01-29: 500 mL

## 2022-01-29 MED ORDER — PROPOFOL 10 MG/ML IV BOLUS
INTRAVENOUS | Status: AC
  Filled 2022-01-29: qty 20

## 2022-01-29 MED ORDER — ONDANSETRON HCL 4 MG/2ML IJ SOLN
INTRAMUSCULAR | Status: AC
  Filled 2022-01-29: qty 2

## 2022-01-29 MED ORDER — SODIUM CHLORIDE 0.9 % IV SOLN
INTRAVENOUS | Status: DC
  Administered 2022-01-29: 1 mL via INTRAVENOUS

## 2022-01-29 MED ORDER — SODIUM CHLORIDE 0.9 % IR SOLN: 3000.0000 mL | Status: DC

## 2022-01-29 MED ORDER — FINASTERIDE 5 MG PO TABS
5.0000 mg | ORAL_TABLET | Freq: Every day | ORAL | Status: DC
  Administered 2022-01-29: 5 mg via ORAL
  Filled 2022-01-29: qty 1

## 2022-01-29 MED ORDER — CEFAZOLIN SODIUM-DEXTROSE 2-4 GM/100ML-% IV SOLN
INTRAVENOUS | Status: AC
  Filled 2022-01-29: qty 100

## 2022-01-29 MED ORDER — ONDANSETRON HCL 4 MG/2ML IJ SOLN
INTRAMUSCULAR | Status: DC | PRN
  Administered 2022-01-29: 4 mg via INTRAVENOUS

## 2022-01-29 MED ORDER — ONDANSETRON HCL 4 MG/2ML IJ SOLN: 4.0000 mg | INTRAMUSCULAR | Status: DC | PRN

## 2022-01-29 MED ORDER — FENTANYL CITRATE (PF) 100 MCG/2ML IJ SOLN
INTRAMUSCULAR | Status: DC | PRN
Start: 2022-01-29 — End: 2022-01-29
  Administered 2022-01-29: 100 ug via INTRAVENOUS

## 2022-01-29 MED ORDER — ROCURONIUM BROMIDE 10 MG/ML (PF) SYRINGE
PREFILLED_SYRINGE | INTRAVENOUS | Status: AC
  Filled 2022-01-29: qty 10

## 2022-01-29 MED ORDER — MIDAZOLAM HCL 2 MG/2ML IJ SOLN
INTRAMUSCULAR | Status: AC
  Filled 2022-01-29: qty 2

## 2022-01-29 MED ORDER — BUPROPION HCL ER (XL) 150 MG PO TB24
150.0000 mg | ORAL_TABLET | Freq: Every morning | ORAL | Status: DC
  Filled 2022-01-29: qty 1

## 2022-01-29 MED ORDER — EPHEDRINE SULFATE (PRESSORS) 50 MG/ML IJ SOLN
INTRAMUSCULAR | Status: DC | PRN
  Administered 2022-01-29: 5 mg via INTRAVENOUS
  Administered 2022-01-29 (×2): 10 mg via INTRAVENOUS

## 2022-01-29 MED ORDER — OXYBUTYNIN CHLORIDE 5 MG PO TABS
ORAL_TABLET | ORAL | Status: AC
  Filled 2022-01-29: qty 1

## 2022-01-29 MED ORDER — DIPHENHYDRAMINE HCL 12.5 MG/5ML PO ELIX: 12.5000 mg | ORAL_SOLUTION | Freq: Four times a day (QID) | ORAL | Status: DC | PRN

## 2022-01-29 MED ORDER — GLYCOPYRROLATE PF 0.2 MG/ML IJ SOSY
PREFILLED_SYRINGE | INTRAMUSCULAR | Status: AC
  Filled 2022-01-29: qty 1

## 2022-01-29 MED ORDER — LIDOCAINE 2% (20 MG/ML) 5 ML SYRINGE
INTRAMUSCULAR | Status: DC | PRN
  Administered 2022-01-29: 40 mg via INTRAVENOUS

## 2022-01-29 MED ORDER — DEXAMETHASONE SODIUM PHOSPHATE 10 MG/ML IJ SOLN
INTRAMUSCULAR | Status: AC
  Filled 2022-01-29: qty 1

## 2022-01-29 MED ORDER — EPHEDRINE 5 MG/ML INJ
INTRAVENOUS | Status: AC
  Filled 2022-01-29: qty 5

## 2022-01-29 MED ORDER — DIPHENHYDRAMINE HCL 50 MG/ML IJ SOLN: 12.5000 mg | Freq: Four times a day (QID) | INTRAMUSCULAR | Status: DC | PRN

## 2022-01-29 MED ORDER — RISPERIDONE 2 MG PO TABS
2.0000 mg | ORAL_TABLET | Freq: Every day | ORAL | Status: DC
  Administered 2022-01-29: 2 mg via ORAL
  Filled 2022-01-29: qty 1

## 2022-01-29 MED ORDER — PROPOFOL 10 MG/ML IV BOLUS
INTRAVENOUS | Status: DC | PRN
  Administered 2022-01-29: 200 mg via INTRAVENOUS

## 2022-01-29 MED ORDER — HYDROCODONE-ACETAMINOPHEN 5-325 MG PO TABS: 1.0000 | ORAL_TABLET | ORAL | Status: DC | PRN

## 2022-01-29 MED ORDER — DEXAMETHASONE SODIUM PHOSPHATE 10 MG/ML IJ SOLN
INTRAMUSCULAR | Status: DC | PRN
  Administered 2022-01-29: 10 mg via INTRAVENOUS

## 2022-01-29 MED ORDER — FENTANYL CITRATE (PF) 100 MCG/2ML IJ SOLN
INTRAMUSCULAR | Status: AC
  Filled 2022-01-29: qty 2

## 2022-01-29 MED ORDER — FLUOXETINE HCL 20 MG PO CAPS
60.0000 mg | ORAL_CAPSULE | Freq: Every day | ORAL | Status: DC
  Filled 2022-01-29: qty 3

## 2022-01-29 MED ORDER — ZOLPIDEM TARTRATE 5 MG PO TABS: 5.0000 mg | ORAL_TABLET | Freq: Every evening | ORAL | Status: DC | PRN

## 2022-01-29 MED ORDER — STERILE WATER FOR IRRIGATION IR SOLN
Status: DC | PRN
  Administered 2022-01-29: 500 mL

## 2022-01-29 MED ORDER — ARTIFICIAL TEARS OPHTHALMIC OINT
TOPICAL_OINTMENT | OPHTHALMIC | Status: AC
  Filled 2022-01-29: qty 3.5

## 2022-01-29 MED ORDER — OXYBUTYNIN CHLORIDE 5 MG PO TABS
5.0000 mg | ORAL_TABLET | Freq: Three times a day (TID) | ORAL | Status: DC | PRN
  Administered 2022-01-29: 5 mg via ORAL

## 2022-01-29 MED ORDER — LACTATED RINGERS IV SOLN: INTRAVENOUS | Status: DC

## 2022-01-29 MED ORDER — ACETAMINOPHEN 325 MG PO TABS: 650.0000 mg | ORAL_TABLET | ORAL | Status: DC | PRN

## 2022-01-29 MED ORDER — MIDAZOLAM HCL 5 MG/5ML IJ SOLN
INTRAMUSCULAR | Status: DC | PRN
  Administered 2022-01-29: 2 mg via INTRAVENOUS

## 2022-01-29 MED ORDER — CLONAZEPAM 0.5 MG PO TABS
0.5000 mg | ORAL_TABLET | Freq: Two times a day (BID) | ORAL | Status: DC
  Administered 2022-01-29 (×2): 0.5 mg via ORAL
  Filled 2022-01-29 (×2): qty 1

## 2022-01-29 MED ORDER — LIDOCAINE HCL (PF) 2 % IJ SOLN
INTRAMUSCULAR | Status: AC
  Filled 2022-01-29: qty 5

## 2022-01-29 MED ORDER — CEFAZOLIN SODIUM-DEXTROSE 2-4 GM/100ML-% IV SOLN
2.0000 g | INTRAVENOUS | Status: AC
  Administered 2022-01-29: 2 g via INTRAVENOUS

## 2022-01-29 SURGICAL SUPPLY — 31 items
BAG DRAIN URO-CYSTO SKYTR STRL (DRAIN) ×1 IMPLANT
BAG DRN RND TRDRP ANRFLXCHMBR (UROLOGICAL SUPPLIES) ×1
BAG DRN UROCATH (DRAIN) ×1
BAG URINE DRAIN 2000ML AR STRL (UROLOGICAL SUPPLIES) ×1 IMPLANT
BAG URINE LEG 500ML (DRAIN) IMPLANT
CATH FOL 3WAY LX 20X30 (CATHETERS) IMPLANT
CATH FOLEY 3WAY 30CC 22FR (CATHETERS) IMPLANT
CATH HEMA 3WAY 30CC 22FR COUDE (CATHETERS) ×1 IMPLANT
CATH HEMA 3WAY 30CC 24FR COUDE (CATHETERS) IMPLANT
CATH HEMA 3WAY 30CC 24FR RND (CATHETERS) IMPLANT
CLOTH BEACON ORANGE TIMEOUT ST (SAFETY) ×1 IMPLANT
ELECT REM PT RETURN 9FT ADLT (ELECTROSURGICAL)
ELECTRODE REM PT RTRN 9FT ADLT (ELECTROSURGICAL) IMPLANT
EVACUATOR MICROVAS BLADDER (UROLOGICAL SUPPLIES) IMPLANT
GLOVE BIO SURGEON STRL SZ7.5 (GLOVE) ×1 IMPLANT
GOWN STRL REUS W/TWL LRG LVL3 (GOWN DISPOSABLE) ×1 IMPLANT
HOLDER FOLEY CATH W/STRAP (MISCELLANEOUS) IMPLANT
IV NS IRRIG 3000ML ARTHROMATIC (IV SOLUTION) ×2 IMPLANT
KIT TURNOVER CYSTO (KITS) ×1 IMPLANT
LOOP CUT BIPOLAR 24F LRG (ELECTROSURGICAL) ×1 IMPLANT
MANIFOLD NEPTUNE II (INSTRUMENTS) ×1 IMPLANT
NS IRRIG 500ML POUR BTL (IV SOLUTION) IMPLANT
PACK CYSTO (CUSTOM PROCEDURE TRAY) ×1 IMPLANT
PLUG CATH AND CAP STER (CATHETERS) IMPLANT
SOL PREP POV-IOD 4OZ 10% (MISCELLANEOUS) IMPLANT
SYR 30ML LL (SYRINGE) ×1 IMPLANT
SYR TOOMEY IRRIG 70ML (MISCELLANEOUS) ×1
SYRINGE TOOMEY IRRIG 70ML (MISCELLANEOUS) ×1 IMPLANT
TUBE CONNECTING 12X1/4 (SUCTIONS) IMPLANT
TUBING UROLOGY SET (TUBING) ×1 IMPLANT
WATER STERILE IRR 500ML POUR (IV SOLUTION) IMPLANT

## 2022-01-29 NOTE — Op Note (Signed)
Preoperative diagnosis:  1.  BPH with obstruction  Postoperative diagnosis: 1.  Same  Procedure(s): 1.  Cystoscopy transurethral section of prostate  Surgeon: Dr. Harold Barban  Anesthesia: General  Complications: None  EBL: Minimal  Specimens: Prostate chips  Disposition of specimens: To pathology  Intraoperative findings: Mild bilobar prostatic hypertrophy with elevated median lobe, short prostate approximately 2 and half centimeters.  Median lobe resected and lateral lobes also trimmed to create nice open channel through the prostate  Indication: 51 year old white male with progressive bladder outlet symptoms unresponsive to medical therapy.  Has elevated residuals well.  Presents at this time to undergo cystoscopy and TURP  Description of procedure:  After obtaining for consent the patient taken major cystoscopy suite placed under general anesthesia.  Placed in the dorsolithotomy position genitalia prepped and draped in usual sterile fashion.  Proper pause and timeout was performed.  21 Fransico was Durene Fruits in the bladder with above-noted findings.  Bladder appeared grossly normal except for large capacity.  Main prostatic issues seem to be the elevated bladder neck/median lobe.  Cystoscope was removed the urethra was dilated with [employer] sounds up to 28 [place].  The 95 French continuous-flow saline resectoscope was then placed utilizing the direct visualized obturator without difficulty.  Utilizing the 30 degree loop anterior commissure was released.  Lateral lobes were then resected down to capsular fibers extending from the bladder neck to the level just proximal to the verumontanum to spare the area of the external striated sphincter.  Median lobe was then taken down to the level of the bladder neck proper taking care not to undermine the bladder neck.  Prostatic chips were then irrigated from the bladder.  Second look in the bladder revealed no remaining chips.  The cautery loop  was utilized to obtain good hemostasis.  20 French three-way Foley was placed to light CBI irrigation with normal saline.  The effluent was light pink to clear at termination of the case.  Procedure was terminated he was awake from anesthesia and taken back to the recovery room in stable condition.  No immediate complication from the procedure.

## 2022-01-29 NOTE — Transfer of Care (Signed)
Immediate Anesthesia Transfer of Care Note  Patient: Eric Hopkins  Procedure(s) Performed: TRANSURETHRAL RESECTION OF THE PROSTATE (TURP) (Prostate)  Patient Location: PACU  Anesthesia Type:General  Level of Consciousness: drowsy, patient cooperative and responds to stimulation  Airway & Oxygen Therapy: Patient Spontanous Breathing  Post-op Assessment: Report given to RN and Post -op Vital signs reviewed and stable  Post vital signs: Reviewed and stable  Last Vitals:  Vitals Value Taken Time  BP 115/68 01/29/22 0840  Temp 36.5 C 01/29/22 0840  Pulse 62 01/29/22 0845  Resp 10 01/29/22 0845  SpO2 92 % 01/29/22 0845  Vitals shown include unvalidated device data.  Last Pain:  Vitals:   01/29/22 0609  TempSrc: Oral  PainSc: 0-No pain      Patients Stated Pain Goal: 5 (03/16/10 7356)  Complications: No notable events documented.

## 2022-01-29 NOTE — Anesthesia Procedure Notes (Signed)
Procedure Name: LMA Insertion Date/Time: 01/29/2022 7:37 AM  Performed by: Rogers Blocker, CRNAPre-anesthesia Checklist: Patient identified, Emergency Drugs available, Suction available and Patient being monitored Patient Re-evaluated:Patient Re-evaluated prior to induction Oxygen Delivery Method: Circle System Utilized Preoxygenation: Pre-oxygenation with 100% oxygen Induction Type: IV induction Ventilation: Mask ventilation without difficulty LMA: LMA inserted LMA Size: 5.0 Number of attempts: 1 Placement Confirmation: positive ETCO2 Tube secured with: Tape Dental Injury: Teeth and Oropharynx as per pre-operative assessment

## 2022-01-29 NOTE — Anesthesia Preprocedure Evaluation (Addendum)
Anesthesia Evaluation  Patient identified by MRN, date of birth, ID band Patient awake    Reviewed: Allergy & Precautions, NPO status , Patient's Chart, lab work & pertinent test results  Airway Mallampati: III  TM Distance: >3 FB     Dental  (+) Teeth Intact, Dental Advisory Given   Pulmonary former smoker,    breath sounds clear to auscultation       Cardiovascular negative cardio ROS   Rhythm:Regular Rate:Normal     Neuro/Psych PSYCHIATRIC DISORDERS Anxiety Depression    GI/Hepatic Neg liver ROS,   Endo/Other  negative endocrine ROS  Renal/GU      Musculoskeletal   Abdominal Normal abdominal exam  (+)   Peds  Hematology   Anesthesia Other Findings   Reproductive/Obstetrics                            Anesthesia Physical Anesthesia Plan  ASA: 2  Anesthesia Plan: General   Post-op Pain Management:    Induction: Intravenous  PONV Risk Score and Plan: 3 and Ondansetron, Dexamethasone and Midazolam  Airway Management Planned: Oral ETT  Additional Equipment: None  Intra-op Plan:   Post-operative Plan: Extubation in OR  Informed Consent: I have reviewed the patients History and Physical, chart, labs and discussed the procedure including the risks, benefits and alternatives for the proposed anesthesia with the patient or authorized representative who has indicated his/her understanding and acceptance.     Dental advisory given  Plan Discussed with: CRNA  Anesthesia Plan Comments:        Anesthesia Quick Evaluation

## 2022-01-29 NOTE — Anesthesia Postprocedure Evaluation (Signed)
Anesthesia Post Note  Patient: Eric Hopkins  Procedure(s) Performed: TRANSURETHRAL RESECTION OF THE PROSTATE (TURP) (Prostate)     Patient location during evaluation: PACU Anesthesia Type: General Level of consciousness: awake and alert Pain management: pain level controlled Vital Signs Assessment: post-procedure vital signs reviewed and stable Respiratory status: spontaneous breathing, nonlabored ventilation, respiratory function stable and patient connected to nasal cannula oxygen Cardiovascular status: blood pressure returned to baseline and stable Postop Assessment: no apparent nausea or vomiting Anesthetic complications: no   No notable events documented.  Last Vitals:  Vitals:   01/29/22 0945 01/29/22 1345  BP: 110/78 112/70  Pulse: 69 69  Resp: 14 12  Temp: (!) 36.3 C 36.4 C  SpO2: 96% 100%    Last Pain:  Vitals:   01/29/22 1345  TempSrc:   PainSc: 0-No pain                 Effie Berkshire

## 2022-01-29 NOTE — Interval H&P Note (Signed)
History and Physical Interval Note:  01/29/2022 7:25 AM  Eric Hopkins  has presented today for surgery, with the diagnosis of BENIGN PROSTATIC HYPERPLASIA.  The various methods of treatment have been discussed with the patient and family. After consideration of risks, benefits and other options for treatment, the patient has consented to  Procedure(s) with comments: [place] (TURP) (N/A) - 1 HR as a surgical intervention.  The patient's history has been reviewed, patient examined, no change in status, stable for surgery.  I have reviewed the patient's chart and labs.  Questions were answered to the patient's satisfaction.     Remi Haggard

## 2022-01-30 ENCOUNTER — Encounter (HOSPITAL_BASED_OUTPATIENT_CLINIC_OR_DEPARTMENT_OTHER): Payer: Self-pay | Admitting: Urology

## 2022-01-30 LAB — SURGICAL PATHOLOGY

## 2022-01-30 NOTE — Final Progress Note (Signed)
1 Day Post-Op Subjective: Patient doing well, minimal discomfort.  Urine is clear CBI has been clamped overnight.  No overnight issues.  Objective: Vital signs in last 24 hours: Temp:  [97.3 F (36.3 C)-98 F (36.7 C)] 97.4 F (36.3 C) (09/06 0629) Pulse Rate:  [59-82] 68 (09/06 0629) Resp:  [10-20] 16 (09/06 0629) BP: (96-115)/(63-78) 99/68 (09/06 0629) SpO2:  [92 %-100 %] 96 % (09/06 0629)  Intake/Output from previous day: 09/05 0701 - 09/06 0700 In: 9450 [P.O.:4100; I.V.:1300; IV Piggyback:100] Out: 94503 [Urine:12400; Blood:5] Intake/Output this shift: No intake/output data recorded.  Physical Exam:  General: Alert and oriented GU: Foley catheter draining clear urine  Lab Results: No results for input(s): "HGB", "HCT" in the last 72 hours. BMET No results for input(s): "NA", "K", "CL", "CO2", "GLUCOSE", "BUN", "CREATININE", "CALCIUM" in the last 72 hours.   Studies/Results: No results found.  Assessment/Plan: Status post TURP for BPH, doing well Plan/recommendation: Plan to discharge home with Foley catheter return 02/01/2022 for Foley catheter removal in the office.    LOS: 0 days   Remi Haggard 01/30/2022, 7:32 AM

## 2022-01-30 NOTE — Discharge Summary (Signed)
Date of admission: 01/29/2022  Date of discharge: 01/30/2022  Admission diagnosis: 1.  BPH with bladder outlet obstruction  Discharge diagnosis: 1.  BPH with bladder outlet obstruction  Secondary diagnoses:   History and Physical: For full details, please see admission history and physical. Briefly, Eric Hopkins is a 51 y.o. year old patient with progressive bladder outlet obstruction which has been minimally responsive to BPH medication.  Presents at this time to undergo cystoscopy and TURP.   Hospital Course: Patient was admitted on 01/29/2022 after undergoing cystoscopy and TURP.  For details procedure please see the typed operative note.  Patient was on continuous bladder irrigation which was weaned off on the first postoperative night.  Urine was clear in the morning and CBI discontinued.  He is felt ready for discharge home.  To be discharged home on routine preoperative medications but will discontinue the syllabus and.  Plan for return office visit on 02/01/2022 for Foley catheter removal.  Patient has our telephone number and knows to call should problems arise relative to his management in the interim.  Laboratory values: No results for input(s): "HGB", "HCT" in the last 72 hours. No results for input(s): "CREATININE" in the last 72 hours.  Disposition: Home  Discharge instruction: The patient was instructed to be ambulatory but told to refrain from heavy lifting, strenuous activity, or driving.   Discharge medications:  Allergies as of 01/30/2022   No Known Allergies      Medication List     STOP taking these medications    silodosin 8 MG Caps capsule Commonly known as: RAPAFLO       TAKE these medications    buPROPion 150 MG 24 hr tablet Commonly known as: WELLBUTRIN XL Take 150 mg by mouth every morning.   CALCIUM + D PO Take 1 tablet by mouth daily.   clonazePAM 0.5 MG tablet Commonly known as: KLONOPIN Take 0.5 mg by mouth 2 (two) times daily as needed for  anxiety.   finasteride 5 MG tablet Commonly known as: PROSCAR Take 5 mg by mouth at bedtime.   FISH OIL PO Take 1 capsule by mouth daily.   FLUoxetine HCl 60 MG Tabs Take 60 mg by mouth daily.   IRON PO Take 1 tablet by mouth daily.   Probiotic Acidophilus Chew Chew 2 capsules by mouth in the morning and at bedtime. Takes two gummies in the morning and two at night. Patient does not know mg dosage.   risperiDONE 2 MG tablet Commonly known as: RISPERDAL Take 2 mg by mouth at bedtime.   triamcinolone ointment 0.5 % Commonly known as: KENALOG Apply 1 application topically 2 (two) times daily.        Followup:   Follow-up Information     Remi Haggard, MD Follow up on 02/01/2022.   Specialty: Urology Why: Return appointment 02/01/2022 at 8 AM for Foley catheter removal Contact information: [address] [place] [address] [phone]

## 2022-02-02 ENCOUNTER — Encounter (HOSPITAL_COMMUNITY): Payer: Self-pay

## 2022-02-02 ENCOUNTER — Emergency Department (HOSPITAL_COMMUNITY): Payer: Medicare HMO

## 2022-02-02 ENCOUNTER — Other Ambulatory Visit: Payer: Self-pay

## 2022-02-02 ENCOUNTER — Emergency Department (HOSPITAL_COMMUNITY)
Admission: EM | Admit: 2022-02-02 | Discharge: 2022-02-02 | Disposition: A | Discharge status: Home / Self Care | Payer: Medicare HMO | Attending: Emergency Medicine | Admitting: Emergency Medicine

## 2022-02-02 DIAGNOSIS — N390 Urinary tract infection, site not specified: Secondary | ICD-10-CM | POA: Insufficient documentation

## 2022-02-02 DIAGNOSIS — D72829 Elevated white blood cell count, unspecified: Secondary | ICD-10-CM | POA: Diagnosis not present

## 2022-02-02 DIAGNOSIS — R000 Tachycardia, unspecified: Secondary | ICD-10-CM | POA: Insufficient documentation

## 2022-02-02 DIAGNOSIS — R110 Nausea: Secondary | ICD-10-CM | POA: Diagnosis not present

## 2022-02-02 DIAGNOSIS — A419 Sepsis, unspecified organism: Secondary | ICD-10-CM | POA: Diagnosis not present

## 2022-02-02 DIAGNOSIS — Z9889 Other specified postprocedural states: Secondary | ICD-10-CM | POA: Insufficient documentation

## 2022-02-02 LAB — CBC WITH DIFFERENTIAL/PLATELET
Abs Immature Granulocytes: 0.03 10*3/uL (ref 0.00–0.07)
Basophils Absolute: 0 10*3/uL (ref 0.0–0.1)
Basophils Relative: 0 %
Eosinophils Absolute: 0.1 10*3/uL (ref 0.0–0.5)
Eosinophils Relative: 1 %
HCT: 37.2 % — ABNORMAL LOW (ref 39.0–52.0)
Hemoglobin: 12.8 g/dL — ABNORMAL LOW (ref 13.0–17.0)
Immature Granulocytes: 0 %
Lymphocytes Relative: 5 %
Lymphs Abs: 0.5 10*3/uL — ABNORMAL LOW (ref 0.7–4.0)
MCH: 30.8 pg (ref 26.0–34.0)
MCHC: 34.4 g/dL (ref 30.0–36.0)
MCV: 89.4 fL (ref 80.0–100.0)
Monocytes Absolute: 0.6 10*3/uL (ref 0.1–1.0)
Monocytes Relative: 6 %
Neutro Abs: 9.6 10*3/uL — ABNORMAL HIGH (ref 1.7–7.7)
Neutrophils Relative %: 88 %
Platelets: 154 10*3/uL (ref 150–400)
RBC: 4.16 MIL/uL — ABNORMAL LOW (ref 4.22–5.81)
RDW: 11.9 % (ref 11.5–15.5)
WBC: 10.9 10*3/uL — ABNORMAL HIGH (ref 4.0–10.5)
nRBC: 0 % (ref 0.0–0.2)

## 2022-02-02 LAB — URINALYSIS, ROUTINE W REFLEX MICROSCOPIC
Bilirubin Urine: NEGATIVE
Glucose, UA: NEGATIVE mg/dL
Ketones, ur: NEGATIVE mg/dL
Nitrite: POSITIVE — AB
Protein, ur: 100 mg/dL — AB
RBC / HPF: >50 RBC/hpf — ABNORMAL HIGH (ref 0–5)
Specific Gravity, Urine: 1.014 (ref 1.005–1.030)
WBC, UA: >50 WBC/hpf — ABNORMAL HIGH (ref 0–5)
pH: 5 (ref 5.0–8.0)

## 2022-02-02 LAB — COMPREHENSIVE METABOLIC PANEL
ALT: 19 U/L (ref 0–44)
AST: 19 U/L (ref 15–41)
Albumin: 4.6 g/dL (ref 3.5–5.0)
Alkaline Phosphatase: 45 U/L (ref 38–126)
Anion gap: 7 (ref 5–15)
BUN: 18 mg/dL (ref 6–20)
CO2: 26 mmol/L (ref 22–32)
Calcium: 9.2 mg/dL (ref 8.9–10.3)
Chloride: 103 mmol/L (ref 98–111)
Creatinine, Ser: 0.97 mg/dL (ref 0.61–1.24)
GFR, Estimated: >60 mL/min (ref >60)
Glucose, Bld: 104 mg/dL — ABNORMAL HIGH (ref 70–99)
Potassium: 3.8 mmol/L (ref 3.5–5.1)
Sodium: 136 mmol/L (ref 135–145)
Total Bilirubin: 1.3 mg/dL — ABNORMAL HIGH (ref 0.3–1.2)
Total Protein: 7.5 g/dL (ref 6.5–8.1)

## 2022-02-02 LAB — PROTIME-INR
INR: 1.1 (ref 0.8–1.2)
Prothrombin Time: 14.5 seconds (ref 11.4–15.2)

## 2022-02-02 LAB — LACTIC ACID, PLASMA: Lactic Acid, Venous: 0.7 mmol/L (ref 0.5–1.9)

## 2022-02-02 MED ORDER — ACETAMINOPHEN 500 MG PO TABS
1000.0000 mg | ORAL_TABLET | Freq: Once | ORAL | Status: AC
Start: 2022-02-02 — End: 2022-02-02
  Administered 2022-02-02: 1000 mg via ORAL
  Filled 2022-02-02: qty 2

## 2022-02-02 MED ORDER — SODIUM CHLORIDE 0.9 % IV BOLUS
1000.0000 mL | Freq: Once | INTRAVENOUS | Status: AC
  Administered 2022-02-02: 1000 mL via INTRAVENOUS

## 2022-02-02 MED ORDER — CIPROFLOXACIN HCL 500 MG PO TABS: 500.0000 mg | ORAL_TABLET | Freq: Two times a day (BID) | ORAL | 0 refills | Status: DC

## 2022-02-02 MED ORDER — SODIUM CHLORIDE 0.9 % IV SOLN: 1.0000 g | Freq: Once | INTRAVENOUS | Status: DC

## 2022-02-02 MED ORDER — KETOROLAC TROMETHAMINE 30 MG/ML IJ SOLN
15.0000 mg | Freq: Once | INTRAMUSCULAR | Status: AC
  Administered 2022-02-02: 15 mg via INTRAVENOUS
  Filled 2022-02-02: qty 1

## 2022-02-02 MED ORDER — CIPROFLOXACIN IN D5W 400 MG/200ML IV SOLN
400.0000 mg | Freq: Once | INTRAVENOUS | Status: AC
  Administered 2022-02-02: 400 mg via INTRAVENOUS
  Filled 2022-02-02: qty 200

## 2022-02-02 NOTE — Discharge Instructions (Addendum)
Please be sure to monitor your condition carefully and do not hesitate to return here for concerning changes in your condition.

## 2022-02-02 NOTE — ED Notes (Signed)
Patient transported to X-ray 

## 2022-02-02 NOTE — ED Notes (Signed)
ED Provider at bedside. 

## 2022-02-02 NOTE — ED Notes (Signed)
Patient reports chills with visible shivering. MD made aware.

## 2022-02-02 NOTE — ED Notes (Signed)
Provider aware of trending HR.

## 2022-02-02 NOTE — ED Notes (Signed)
Patient provided with urinal and aware urine sample is needed.

## 2022-02-02 NOTE — ED Triage Notes (Signed)
"  Just had his catheter removed yesterday" per spouse

## 2022-02-02 NOTE — ED Provider Notes (Signed)
[facility] DEPT Provider Note   CSN: [id number] Arrival date & time: 02/02/22  1208     History  Chief Complaint  Patient presents with   Dysuria    Eric Hopkins is a 51 y.o. male.  HPI Patient presents with his wife who assists with history.  Patient has notable history of a TURP procedure performed 4 days ago.  That was reportedly without complication, and the patient had his Foley catheter removed yesterday.  Today he woke with with rigors, nausea.  He is found to be febrile, and brought here for evaluation.  Patient denies focal pain, describes generalized discomfort, and dysuria, but no substantial hematuria, no inability to urinate.    Home Medications Prior to Admission medications   Medication Sig Start Date End Date Taking? Authorizing Provider  ciprofloxacin (CIPRO) 500 MG tablet Take 1 tablet (500 mg total) by mouth every 12 (twelve) hours. 02/02/22  Yes Carmin Muskrat, MD  buPROPion (WELLBUTRIN XL) 150 MG 24 hr tablet Take 150 mg by mouth every morning. 08/05/21   [provider]  Calcium Citrate-Vitamin D (CALCIUM + D PO) Take 1 tablet by mouth daily.    [provider]  clonazePAM (KLONOPIN) 0.5 MG tablet Take 0.5 mg by mouth 2 (two) times daily as needed for anxiety.    [provider]  Ferrous Sulfate (IRON PO) Take 1 tablet by mouth daily.    [provider]  finasteride (PROSCAR) 5 MG tablet Take 5 mg by mouth at bedtime. 07/27/21   [provider]  FLUoxetine HCl 60 MG TABS Take 60 mg by mouth daily.    [provider]  Omega-3 Fatty Acids (FISH OIL PO) Take 1 capsule by mouth daily.    [provider]  Probiotic Product (PROBIOTIC ACIDOPHILUS) CHEW Chew 2 capsules by mouth in the morning and at bedtime. Takes two gummies in the morning and two at night. Patient does not know mg dosage.    [provider]  risperiDONE (RISPERDAL) 2 MG tablet Take 2 mg by mouth at  bedtime.    [provider]  triamcinolone ointment (KENALOG) 0.5 % Apply 1 application topically 2 (two) times daily. Patient not taking: Reported on 01/14/2022 06/06/21   Lorrene Reid, PA-C      Allergies    Patient has no known allergies.    Review of Systems   Review of Systems  All other systems reviewed and are negative.   Physical Exam Updated Vital Signs BP 108/68   Pulse (!) 113   Temp 98.7 F (37.1 C) (Oral)   Resp 18   Ht 5' 8.5" (1.74 m)   Wt 71.7 kg   SpO2 96%   BMI 23.67 kg/m  Physical Exam Vitals and nursing note reviewed.  Constitutional:      General: He is not in acute distress.    Appearance: He is well-developed.  HENT:     Head: Normocephalic and atraumatic.  Eyes:     Conjunctiva/sclera: Conjunctivae normal.  Cardiovascular:     Rate and Rhythm: Regular rhythm. Tachycardia present.  Pulmonary:     Effort: Pulmonary effort is normal. No respiratory distress.     Breath sounds: No stridor.  Abdominal:     General: There is no distension.  Skin:    General: Skin is warm and dry.  Neurological:     Mental Status: He is alert and oriented to person, place, and time.     ED Results /  Procedures / Treatments   Labs (all labs ordered are listed, but only abnormal results are displayed) Labs Reviewed  URINALYSIS, ROUTINE W REFLEX MICROSCOPIC - Abnormal; Notable for the following components:      Result Value   APPearance HAZY (*)    Hgb urine dipstick LARGE (*)    Protein, ur 100 (*)    Nitrite POSITIVE (*)    Leukocytes,Ua LARGE (*)    RBC / HPF >50 (*)    WBC, UA >50 (*)    Bacteria, UA MANY (*)    All other components within normal limits  COMPREHENSIVE METABOLIC PANEL - Abnormal; Notable for the following components:   Glucose, Bld 104 (*)    Total Bilirubin 1.3 (*)    All other components within normal limits  CBC WITH DIFFERENTIAL/PLATELET - Abnormal; Notable for the following components:   WBC 10.9 (*)    RBC 4.16 (*)     Hemoglobin 12.8 (*)    HCT 37.2 (*)    Neutro Abs 9.6 (*)    Lymphs Abs 0.5 (*)    All other components within normal limits  CULTURE, BLOOD (ROUTINE X 2)  CULTURE, BLOOD (ROUTINE X 2)  URINE CULTURE  LACTIC ACID, PLASMA  PROTIME-INR  CBC WITH DIFFERENTIAL/PLATELET    EKG None  Radiology DG Chest 2 View  Result Date: 02/02/2022 CLINICAL DATA:  Sepsis. EXAM: CHEST - 2 VIEW COMPARISON:  04/06/2020 FINDINGS: The lungs are clear without focal pneumonia, edema, pneumothorax or pleural effusion. The cardiopericardial silhouette is within normal limits for size. The visualized bony structures of the thorax are unremarkable. Telemetry leads overlie the chest. IMPRESSION: No active cardiopulmonary disease. Electronically Signed   By: Misty Stanley M.D.   On: 02/02/2022 13:27    Procedures Procedures    Medications Ordered in ED Medications  acetaminophen (TYLENOL) tablet 1,000 mg (1,000 mg Oral Given 02/02/22 1327)  sodium chloride 0.9 % bolus 1,000 mL (0 mLs Intravenous Stopped 02/02/22 1451)  ciprofloxacin (CIPRO) IVPB 400 mg (0 mg Intravenous Stopped 02/02/22 1633)  sodium chloride 0.9 % bolus 1,000 mL (1,000 mLs Intravenous New Bag/Given 02/02/22 1636)  ketorolac (TORADOL) 30 MG/ML injection 15 mg (15 mg Intravenous Given 02/02/22 1637)    ED Course/ Medical Decision Making/ A&P This patient with a Hx of bladder outlet obstruction, status post TURP 4 days ago presents to the ED for concern of dysuria, fever, this involves an extensive number of treatment options, and is a complaint that carries with it a high risk of complications and morbidity.    The differential diagnosis includes postsurgical infection, bacteremia, sepsis, obstruction   Social Determinants of Health:  No limits  Additional history obtained:  Additional history and/or information obtained from wife at bedside and urology notes from this week, notable for details included above in HPI   After the initial  evaluation, orders, including: Fluids labs Tylenol antibiotics were initiated.   Patient placed on Cardiac and Pulse-Oximetry Monitors. The patient was maintained on a cardiac monitor.  The cardiac monitored showed an rhythm of 90 sinus normal, though the patient spiked into the 120 range, sinus tach abnormal The patient was also maintained on pulse oximetry. The readings were typically 99% room air normal   On repeat evaluation of the patient worsened Not long after patient started antibiotics patient had an episode of rigors, tachycardia, this improved with fluids, Toradol Lab Tests:  I personally interpreted labs.  The pertinent results include: Leukocytosis   Consultations Obtained:  I  requested consultation with the urology,  and discussed lab and imaging findings as well as pertinent plan - they recommend: Urine culture, ongoing antibiotics, if the patient normalizes he will be appropriate for close outpatient follow-up which they will facilitate  Dispostion / Final MDM:  After consideration of the diagnostic results and the patient's response to treatment, adult male presenting 59s after TURP now with fever, tachycardia, on arrival meeting SIRS criteria.  Patient is not hypotensive, is awake, alert, speaking clearly has no pain.  Fever improves, vital signs transiently worsen, but now improved, normalized.  Hospitalization a consideration given the patient's recent procedure, concern for SIRS, but with his improvement here, ongoing antibiotics, and outpatient follow-up arranged by myself with our urology team, the patient is appropriate for discharge.  Final Clinical Impression(s) / ED Diagnoses Final diagnoses:  Lower urinary tract infectious disease    Rx / DC Orders ED Discharge Orders          Ordered    ciprofloxacin (CIPRO) 500 MG tablet  Every 12 hours        02/02/22 1646              Carmin Muskrat, MD 02/02/22 1753

## 2022-02-02 NOTE — ED Triage Notes (Signed)
"  Had prostate surgery on Tuesday and now I am running a fever and having chills and pain when I urinate" per pt

## 2022-02-05 LAB — URINE CULTURE: Culture: >=100000 — AB

## 2022-02-06 ENCOUNTER — Telehealth (HOSPITAL_BASED_OUTPATIENT_CLINIC_OR_DEPARTMENT_OTHER): Payer: Self-pay | Admitting: Emergency Medicine

## 2022-02-06 DIAGNOSIS — R69 Illness, unspecified: Secondary | ICD-10-CM | POA: Diagnosis not present

## 2022-02-06 DIAGNOSIS — F411 Generalized anxiety disorder: Secondary | ICD-10-CM | POA: Diagnosis not present

## 2022-02-06 DIAGNOSIS — F332 Major depressive disorder, recurrent severe without psychotic features: Secondary | ICD-10-CM | POA: Diagnosis not present

## 2022-02-06 NOTE — Telephone Encounter (Signed)
Post ED Visit - Positive Culture Follow-up  Culture report reviewed by antimicrobial stewardship pharmacist: [facility] Team '[]'$  Elenor Quinones, Pharm.D. '[]'$  Heide Guile, Pharm.D., BCPS AQ-ID '[]'$  Parks Neptune, Pharm.D., BCPS '[x]'$  Alycia Rossetti, Pharm.D., BCPS '[]'$  [place], [place].D., BCPS, AAHIVP '[]'$  Legrand Como, Pharm.D., BCPS, AAHIVP '[]'$  Salome Arnt, PharmD, BCPS '[]'$  Johnnette Gourd, PharmD, BCPS '[]'$  Hughes Better, PharmD, BCPS '[]'$  Leeroy Cha, PharmD '[]'$  Laqueta Linden, PharmD, BCPS '[]'$  Albertina Parr, PharmD  [facility] Team '[]'$  Leodis Sias, PharmD '[]'$  Lindell Spar, PharmD '[]'$  Royetta Asal, PharmD '[]'$  Graylin Shiver, Rph '[]'$  Rema Fendt) Glennon Mac, PharmD '[]'$  Arlyn Dunning, PharmD '[]'$  Netta Cedars, PharmD '[]'$  Dia Sitter, PharmD '[]'$  Leone Haven, PharmD '[]'$  Gretta Arab, PharmD '[]'$  Theodis Shove, PharmD '[]'$  Peggyann Juba, PharmD '[]'$  Reuel Boom, PharmD   Positive urine culture Treated with ciprofloxacin, organism sensitive to the same and no further patient follow-up is required at this time.  Hazle Nordmann 02/06/2022, 12:57 PM

## 2022-02-07 LAB — CULTURE, BLOOD (ROUTINE X 2)
Culture: NO GROWTH
Culture: NO GROWTH
Special Requests: ADEQUATE

## 2022-02-13 DIAGNOSIS — F411 Generalized anxiety disorder: Secondary | ICD-10-CM | POA: Diagnosis not present

## 2022-02-13 DIAGNOSIS — F332 Major depressive disorder, recurrent severe without psychotic features: Secondary | ICD-10-CM | POA: Diagnosis not present

## 2022-02-13 DIAGNOSIS — R69 Illness, unspecified: Secondary | ICD-10-CM | POA: Diagnosis not present

## 2022-02-20 DIAGNOSIS — F332 Major depressive disorder, recurrent severe without psychotic features: Secondary | ICD-10-CM | POA: Diagnosis not present

## 2022-02-20 DIAGNOSIS — F411 Generalized anxiety disorder: Secondary | ICD-10-CM | POA: Diagnosis not present

## 2022-02-20 DIAGNOSIS — R69 Illness, unspecified: Secondary | ICD-10-CM | POA: Diagnosis not present

## 2022-02-22 DIAGNOSIS — R69 Illness, unspecified: Secondary | ICD-10-CM | POA: Diagnosis not present

## 2022-02-22 DIAGNOSIS — L821 Other seborrheic keratosis: Secondary | ICD-10-CM | POA: Diagnosis not present

## 2022-02-22 DIAGNOSIS — L905 Scar conditions and fibrosis of skin: Secondary | ICD-10-CM | POA: Diagnosis not present

## 2022-02-22 DIAGNOSIS — F410 Panic disorder [episodic paroxysmal anxiety] without agoraphobia: Secondary | ICD-10-CM | POA: Diagnosis not present

## 2022-02-22 DIAGNOSIS — D225 Melanocytic nevi of trunk: Secondary | ICD-10-CM | POA: Diagnosis not present

## 2022-02-22 DIAGNOSIS — D2261 Melanocytic nevi of right upper limb, including shoulder: Secondary | ICD-10-CM | POA: Diagnosis not present

## 2022-02-22 DIAGNOSIS — F330 Major depressive disorder, recurrent, mild: Secondary | ICD-10-CM | POA: Diagnosis not present

## 2022-02-22 DIAGNOSIS — D485 Neoplasm of uncertain behavior of skin: Secondary | ICD-10-CM | POA: Diagnosis not present

## 2022-02-23 ENCOUNTER — Emergency Department (HOSPITAL_COMMUNITY)
Admission: EM | Admit: 2022-02-23 | Discharge: 2022-02-23 | Disposition: A | Discharge status: Home / Self Care | Payer: Medicare HMO | Attending: Emergency Medicine | Admitting: Emergency Medicine

## 2022-02-23 ENCOUNTER — Other Ambulatory Visit: Payer: Self-pay

## 2022-02-23 ENCOUNTER — Encounter (HOSPITAL_COMMUNITY): Payer: Self-pay | Admitting: Emergency Medicine

## 2022-02-23 DIAGNOSIS — R10A2 Flank pain, left side: Secondary | ICD-10-CM

## 2022-02-23 DIAGNOSIS — R109 Unspecified abdominal pain: Secondary | ICD-10-CM | POA: Diagnosis not present

## 2022-02-23 DIAGNOSIS — R319 Hematuria, unspecified: Secondary | ICD-10-CM | POA: Insufficient documentation

## 2022-02-23 DIAGNOSIS — R10814 Left lower quadrant abdominal tenderness: Secondary | ICD-10-CM | POA: Diagnosis not present

## 2022-02-23 DIAGNOSIS — R300 Dysuria: Secondary | ICD-10-CM | POA: Insufficient documentation

## 2022-02-23 LAB — COMPREHENSIVE METABOLIC PANEL
ALT: 18 U/L (ref 0–44)
AST: 18 U/L (ref 15–41)
Albumin: 4.1 g/dL (ref 3.5–5.0)
Alkaline Phosphatase: 48 U/L (ref 38–126)
Anion gap: 6 (ref 5–15)
BUN: 12 mg/dL (ref 6–20)
CO2: 27 mmol/L (ref 22–32)
Calcium: 9 mg/dL (ref 8.9–10.3)
Chloride: 101 mmol/L (ref 98–111)
Creatinine, Ser: 0.87 mg/dL (ref 0.61–1.24)
GFR, Estimated: >60 mL/min (ref >60)
Glucose, Bld: 89 mg/dL (ref 70–99)
Potassium: 4 mmol/L (ref 3.5–5.1)
Sodium: 134 mmol/L — ABNORMAL LOW (ref 135–145)
Total Bilirubin: 0.6 mg/dL (ref 0.3–1.2)
Total Protein: 6.9 g/dL (ref 6.5–8.1)

## 2022-02-23 LAB — CBC WITH DIFFERENTIAL/PLATELET
Abs Immature Granulocytes: 0.01 10*3/uL (ref 0.00–0.07)
Basophils Absolute: 0.1 10*3/uL (ref 0.0–0.1)
Basophils Relative: 1 %
Eosinophils Absolute: 0.4 10*3/uL (ref 0.0–0.5)
Eosinophils Relative: 6 %
HCT: 34.9 % — ABNORMAL LOW (ref 39.0–52.0)
Hemoglobin: 11.9 g/dL — ABNORMAL LOW (ref 13.0–17.0)
Immature Granulocytes: 0 %
Lymphocytes Relative: 35 %
Lymphs Abs: 2.3 10*3/uL (ref 0.7–4.0)
MCH: 31.1 pg (ref 26.0–34.0)
MCHC: 34.1 g/dL (ref 30.0–36.0)
MCV: 91.1 fL (ref 80.0–100.0)
Monocytes Absolute: 0.6 10*3/uL (ref 0.1–1.0)
Monocytes Relative: 9 %
Neutro Abs: 3.2 10*3/uL (ref 1.7–7.7)
Neutrophils Relative %: 49 %
Platelets: 244 10*3/uL (ref 150–400)
RBC: 3.83 MIL/uL — ABNORMAL LOW (ref 4.22–5.81)
RDW: 12.1 % (ref 11.5–15.5)
WBC: 6.6 10*3/uL (ref 4.0–10.5)
nRBC: 0 % (ref 0.0–0.2)

## 2022-02-23 LAB — URINALYSIS, ROUTINE W REFLEX MICROSCOPIC
Bilirubin Urine: NEGATIVE
Glucose, UA: NEGATIVE mg/dL
Ketones, ur: NEGATIVE mg/dL
Nitrite: NEGATIVE
Protein, ur: NEGATIVE mg/dL
Specific Gravity, Urine: 1.001 — ABNORMAL LOW (ref 1.005–1.030)
pH: 7 (ref 5.0–8.0)

## 2022-02-23 LAB — LIPASE, BLOOD: Lipase: 35 U/L (ref 11–51)

## 2022-02-23 MED ORDER — CEPHALEXIN 500 MG PO CAPS: 500.0000 mg | ORAL_CAPSULE | Freq: Two times a day (BID) | ORAL | 0 refills | Status: AC

## 2022-02-23 NOTE — ED Provider Notes (Signed)
[facility] DEPT Provider Note   CSN: [id number] Arrival date & time: 02/23/22  1503     History PMH: BPH, recent TURP on 01/29/22 No chief complaint on file.   Eric Hopkins is a 51 y.o. male.  Pt complains of left flank pain.  He had a TURP about 3 weeks ago. He has had on and off dysuria and hematuria since then which he was told was expected. He started developing left flank pain a couple of days ago and his Urologist is out of town so he wanted to make sure he was okay. He recently got off of antibiotics for a UTI that he was seen for on 9/9 where he was placed on Cipro.    He denies any fevers, chills, abdominal pain, nausea, vomiting.  HPI     Home Medications Prior to Admission medications   Medication Sig Start Date End Date Taking? Authorizing Provider  buPROPion (WELLBUTRIN XL) 150 MG 24 hr tablet Take 150 mg by mouth every morning. 08/05/21  Yes [provider]  Calcium Citrate-Vitamin D (CALCIUM + D PO) Take 1 tablet by mouth daily.   Yes [provider]  cephALEXin (KEFLEX) 500 MG capsule Take 1 capsule (500 mg total) by mouth 2 (two) times daily for 5 days. 02/23/22 02/28/22 Yes Vishaal Strollo, Adora Fridge, PA-C  clonazePAM (KLONOPIN) 0.5 MG tablet Take 0.5 mg by mouth 2 (two) times daily as needed for anxiety.   Yes [provider]  Ferrous Sulfate (IRON PO) Take 1 tablet by mouth daily with breakfast.   Yes [provider]  finasteride (PROSCAR) 5 MG tablet Take 5 mg by mouth daily. 07/27/21  Yes [provider]  FLUoxetine (PROZAC) 20 MG capsule Take 20 mg by mouth in the morning.   Yes [provider]  FLUoxetine (PROZAC) 40 MG capsule Take 40 mg by mouth in the morning.   Yes [provider]  Omega-3 Fatty Acids (FISH OIL PO) Take 1 capsule by mouth daily.   Yes [provider]  Probiotic Product (PROBIOTIC ACIDOPHILUS) CHEW Chew 2 tablets by mouth in the morning.   Yes  [provider]  risperiDONE (RISPERDAL) 2 MG tablet Take 2 mg by mouth at bedtime.   Yes [provider]  ciprofloxacin (CIPRO) 500 MG tablet Take 1 tablet (500 mg total) by mouth every 12 (twelve) hours. Patient not taking: Reported on 02/23/2022 02/02/22   Carmin Muskrat, MD  triamcinolone ointment (KENALOG) 0.5 % Apply 1 application topically 2 (two) times daily. Patient not taking: Reported on 01/14/2022 06/06/21   Lorrene Reid, PA-C      Allergies    Patient has no known allergies.    Review of Systems   Review of Systems  Genitourinary:  Positive for dysuria, flank pain and hematuria.  All other systems reviewed and are negative.   Physical Exam Updated Vital Signs BP 111/81   Pulse 65   Temp 98.8 F (37.1 C) (Oral)   Resp 17   Ht '5\' 8"'$  (1.727 m)   Wt 74.4 kg   SpO2 99%   BMI 24.95 kg/m  Physical Exam Vitals and nursing note reviewed.  Constitutional:      General: He is not in acute distress.    Appearance: Normal appearance. He is not ill-appearing, toxic-appearing or diaphoretic.  HENT:     Head: Normocephalic and atraumatic.     Nose: No nasal deformity.     Mouth/Throat:     Lips:  Pink. No lesions.     Mouth: No injury, lacerations, oral lesions or angioedema.     Pharynx: Uvula midline. No pharyngeal swelling or uvula swelling.  Eyes:     General: Gaze aligned appropriately. No scleral icterus.       Right eye: No discharge.        Left eye: No discharge.     Conjunctiva/sclera: Conjunctivae normal.     Right eye: Right conjunctiva is not injected. No exudate or hemorrhage.    Left eye: Left conjunctiva is not injected. No exudate or hemorrhage. Cardiovascular:     Rate and Rhythm: Normal rate and regular rhythm.     Pulses: Normal pulses.          Radial pulses are 2+ on the right side and 2+ on the left side.       Dorsalis pedis pulses are 2+ on the right side and 2+ on the left side.     Heart sounds: Normal heart sounds, S1  normal and S2 normal. Heart sounds not distant. No murmur heard.    No friction rub. No gallop. No S3 or S4 sounds.  Pulmonary:     Effort: Pulmonary effort is normal. No accessory muscle usage or respiratory distress.     Breath sounds: Normal breath sounds. No stridor. No wheezing, rhonchi or rales.  Chest:     Chest wall: No tenderness.  Abdominal:     General: Abdomen is flat. There is no distension.     Palpations: Abdomen is soft. There is no mass or pulsatile mass.     Tenderness: There is no abdominal tenderness. There is no right CVA tenderness, left CVA tenderness, guarding or rebound.     Comments: No pulsatile mass  Musculoskeletal:     Right lower leg: No edema.     Left lower leg: No edema.  Skin:    General: Skin is warm and dry.     Coloration: Skin is not jaundiced or pale.     Findings: No bruising, erythema, lesion or rash.  Neurological:     General: No focal deficit present.     Mental Status: He is alert and oriented to person, place, and time.     GCS: GCS eye subscore is 4. GCS verbal subscore is 5. GCS motor subscore is 6.  Psychiatric:        Mood and Affect: Mood normal.        Behavior: Behavior normal. Behavior is cooperative.     ED Results / Procedures / Treatments   Labs (all labs ordered are listed, but only abnormal results are displayed) Labs Reviewed  URINALYSIS, ROUTINE W REFLEX MICROSCOPIC - Abnormal; Notable for the following components:      Result Value   Specific Gravity, Urine 1.001 (*)    Hgb urine dipstick LARGE (*)    Leukocytes,Ua LARGE (*)    Bacteria, UA RARE (*)    All other components within normal limits  COMPREHENSIVE METABOLIC PANEL - Abnormal; Notable for the following components:   Sodium 134 (*)    All other components within normal limits  CBC WITH DIFFERENTIAL/PLATELET - Abnormal; Notable for the following components:   RBC 3.83 (*)    Hemoglobin 11.9 (*)    HCT 34.9 (*)    All other components within normal  limits  URINE CULTURE  LIPASE, BLOOD    EKG None  Radiology No results found.  Procedures Procedures   Medications Ordered in ED Medications -  No data to display  ED Course/ Medical Decision Making/ A&P                           Medical Decision Making Amount and/or Complexity of Data Reviewed Labs: ordered.  Risk Prescription drug management.    MDM  This is a 51 y.o. male who presents to the ED with left flank pain The differential of this patient includes but is not limited to UTI, pyelo, Kidney stone, AAA  Initial Impression  Well appearing. No acute distress. No abnormal vitals.  Abdominal exam benign, no CVA tenderness.  He does not have any systemic symptoms. I have very low suspicion for kidney stone or AAA given presentation. Will order basic labs and a urine.   I personally ordered, reviewed, and interpreted all laboratory work and imaging and agree with radiologist interpretation. Results interpreted below:  No leukocytosis, Hgb stable. Na 134, renal function normal, LFTs normal, Lipase normal, Lg blood in urine with 11-20 WBC.  Assessment/Plan:  Workup concerning for possible UTI. Patient has no systemic symptoms. Pain is controlled. He is not septic. No hospital indications. Will put on Keflex and have him follow up with Urology.   Charting Requirements Additional history is obtained from:  Independent historian External Records from outside source obtained and reviewed including: Recent labs Social Determinants of Health:  none Pertinant PMH that complicates patient's illness: Recent TURP procedure  Patient Care Problems that were addressed during this visit: - Left Flank Pain: Acute illness Medications given in ED: n/a Reevaluation of the patient after these medicines showed that the patient improved I have reviewed home medications and made changes accordingly.  Critical Care Interventions: n/a Consultations: n/a Disposition:  discharge  Portions of this note were generated with Dragon dictation software. Dictation errors may occur despite best attempts at proofreading.      Final Clinical Impression(s) / ED Diagnoses Final diagnoses:  Left flank pain    Rx / DC Orders ED Discharge Orders          Ordered    cephALEXin (KEFLEX) 500 MG capsule  2 times daily        02/23/22 1853              Adolphus Birchwood, PA-C 02/23/22 1951    Valarie Merino, MD 02/23/22 [phone]

## 2022-02-23 NOTE — ED Triage Notes (Signed)
Pt here from UC with c/o low back pain and blood in his urine ,. Pt is 3 weeks post op from a TURP , pt thinks that everything may be normal but wants to make sure , no fevers

## 2022-02-23 NOTE — Discharge Instructions (Addendum)
I have put you on antibiotics for a possible UTI. Please start taking these and follow up with your urologist.

## 2022-02-23 NOTE — ED Provider Triage Note (Signed)
Emergency Medicine Provider Triage Evaluation Note  Eric Hopkins , a 51 y.o. male  was evaluated in triage.  Pt complains of left flank pain.  He had a TURP about 3 weeks ago. He has had on and off dysuria and hematuria since then which he was told was expected. He started developing left flank pain a couple of days ago and his Urologist is out of town so he wanted to make sure he was okay. He recently got off of antibiotics.   Review of Systems  Positive:  Negative:   Physical Exam  BP 107/62   Pulse 68   Temp 98.9 F (37.2 C) (Oral)   Resp 18   Ht '5\' 8"'$  (1.727 m)   Wt 74.4 kg   SpO2 97%   BMI 24.95 kg/m  Gen:   Awake, no distress   Resp:  Normal effort  MSK:   Moves extremities without difficulty  Other:  Abdomen soft, nontender, no CVA tenderness  Medical Decision Making  Medically screening exam initiated at 3:52 PM.  Appropriate orders placed.  Tyron Russell was informed that the remainder of the evaluation will be completed by another provider, this initial triage assessment does not replace that evaluation, and the importance of remaining in the ED until their evaluation is complete.     Adolphus Birchwood, [place] 02/23/22 1557

## 2022-02-26 LAB — URINE CULTURE: Culture: 60000 — AB

## 2022-02-27 DIAGNOSIS — R69 Illness, unspecified: Secondary | ICD-10-CM | POA: Diagnosis not present

## 2022-02-27 DIAGNOSIS — F332 Major depressive disorder, recurrent severe without psychotic features: Secondary | ICD-10-CM | POA: Diagnosis not present

## 2022-02-27 DIAGNOSIS — F411 Generalized anxiety disorder: Secondary | ICD-10-CM | POA: Diagnosis not present

## 2022-02-28 ENCOUNTER — Telehealth: Payer: Self-pay

## 2022-02-28 DIAGNOSIS — N3000 Acute cystitis without hematuria: Secondary | ICD-10-CM | POA: Diagnosis not present

## 2022-02-28 NOTE — Telephone Encounter (Signed)
     Patient  visit on 9/30  at [facility] you been able to follow up with your primary care physician?  yes  The patient was or was not able to obtain any needed medicine or equipment.  yes  Are there diet recommendations that you are having difficulty following? na  Patient expresses understanding of discharge instructions and education provided has no other needs at this time. yes     [facility], Care Management  [phone] [address], [place], [place] [address] Phone: [phone] Email: [email or web address]

## 2022-03-01 ENCOUNTER — Ambulatory Visit (INDEPENDENT_AMBULATORY_CARE_PROVIDER_SITE_OTHER): Payer: Medicare HMO | Admitting: Physician Assistant

## 2022-03-01 ENCOUNTER — Encounter: Payer: Self-pay | Admitting: Physician Assistant

## 2022-03-01 VITALS — BP 107/70 | HR 80 | Temp 97.5°F | Wt 157.0 lb

## 2022-03-01 DIAGNOSIS — Z23 Encounter for immunization: Secondary | ICD-10-CM

## 2022-03-01 DIAGNOSIS — E781 Pure hyperglyceridemia: Secondary | ICD-10-CM

## 2022-03-01 DIAGNOSIS — E785 Hyperlipidemia, unspecified: Secondary | ICD-10-CM

## 2022-03-01 DIAGNOSIS — Z0000 Encounter for general adult medical examination without abnormal findings: Secondary | ICD-10-CM | POA: Diagnosis not present

## 2022-03-01 NOTE — Progress Notes (Signed)
Subjective:   Eric Hopkins is a 51 y.o. male who presents for Medicare Annual/Subsequent preventive examination.  Review of Systems    General:   No F/C, unintentional wt loss Pulm:   No DIB, SOB, pleuritic chest pain Card:  No CP, palpitations Abd:  No n/v/d or pain Ext:  No inc edema from baseline    Objective:    Today's Vitals   03/01/22 0852  BP: 107/70  Pulse: 80  Temp: (!) 97.5 F (36.4 C)  TempSrc: Temporal  Weight: 157 lb (71.2 kg)   Body mass index is 23.87 kg/m.     02/02/2022   12:37 PM 01/29/2022    6:05 AM 04/06/2020    9:37 AM 09/17/2018   11:21 AM  Advanced Directives  Does Patient Have a Medical Advance Directive? Unable to assess, patient is non-responsive or altered mental status No No No  Would patient like information on creating a medical advance directive?  No - Patient declined  No - Patient declined    Current Medications (verified) Outpatient Encounter Medications as of 03/01/2022  Medication Sig   buPROPion (WELLBUTRIN XL) 150 MG 24 hr tablet Take 150 mg by mouth every morning.   Calcium Citrate-Vitamin D (CALCIUM + D PO) Take 1 tablet by mouth daily.   clonazePAM (KLONOPIN) 0.5 MG tablet Take 0.5 mg by mouth 2 (two) times daily as needed for anxiety.   Ferrous Sulfate (IRON PO) Take 1 tablet by mouth daily with breakfast.   finasteride (PROSCAR) 5 MG tablet Take 5 mg by mouth daily.   FLUoxetine (PROZAC) 20 MG capsule Take 20 mg by mouth in the morning.   FLUoxetine (PROZAC) 40 MG capsule Take 40 mg by mouth in the morning.   Omega-3 Fatty Acids (FISH OIL PO) Take 1 capsule by mouth daily.   Probiotic Product (PROBIOTIC ACIDOPHILUS) CHEW Chew 2 tablets by mouth in the morning.   risperiDONE (RISPERDAL) 2 MG tablet Take 2 mg by mouth at bedtime.   silodosin (RAPAFLO) 4 MG CAPS capsule Take 5 mg by mouth daily with breakfast.   ciprofloxacin (CIPRO) 500 MG tablet Take 1 tablet (500 mg total) by mouth every 12 (twelve) hours. (Patient not  taking: Reported on 02/23/2022)   triamcinolone ointment (KENALOG) 0.5 % Apply 1 application topically 2 (two) times daily. (Patient not taking: Reported on 01/14/2022)   No facility-administered encounter medications on file as of 03/01/2022.    Allergies (verified) Patient has no known allergies.   History: Past Medical History:  Diagnosis Date   Benign localized prostatic hyperplasia with lower urinary tract symptoms (LUTS)    urologist-- dr Milford Cage   Depression    GAD (generalized anxiety disorder)    Hemorrhoids    History of adenomatous polyp of colon    History of precordial chest pain    evaluated by cardiologist--- dr Percival Spanish , note in epic 05-05-2020;  normal CTA 06-16-2020 and ETT 06-06-2020  pt release as needed   Incomplete emptying of bladder    Past Surgical History:  Procedure Laterality Date   CARPAL TUNNEL RELEASE Right 2002   COLONOSCOPY WITH PROPOFOL  06/09/2020   by dr Havery Moros   TONSILLECTOMY AND ADENOIDECTOMY     child   TRANSURETHRAL RESECTION OF PROSTATE N/A 01/29/2022   Procedure: TRANSURETHRAL RESECTION OF THE PROSTATE (TURP);  Surgeon: Remi Haggard, MD;  Location: [facility];  Service: Urology;  Laterality: N/A;  1 HR   Family History  Problem Relation Age  of Onset   Hypertension Mother    Anxiety disorder Mother    Depression Mother    Heart attack Father 75       Died in his sleep age 24   Depression Maternal Grandmother    Anxiety disorder Maternal Grandmother    Hypertension Maternal Grandmother    Alcohol abuse Maternal Grandfather    Colon cancer Neg Hx    Colon polyps Neg Hx    Esophageal cancer Neg Hx    Rectal cancer Neg Hx    Stomach cancer Neg Hx    Social History   Socioeconomic History   Marital status: Significant Other    Spouse name: Not on file   Number of children: Not on file   Years of education: Not on file   Highest education level: Not on file  Occupational History   Not on file  Tobacco  Use   Smoking status: Former    Packs/day: 0.50    Years: 4.00    Total pack years: 2.00    Types: Cigarettes    Quit date: 77    Years since quitting: 26.7   Smokeless tobacco: Never  Vaping Use   Vaping Use: Never used  Substance and Sexual Activity   Alcohol use: Yes    Comment: occassionally   Drug use: Not Currently    Frequency: 1.0 times per week    Types: Marijuana    Comment: per pt last smoked since 2021   Sexual activity: Yes    Birth control/protection: None  Other Topics Concern   Not on file  Social History Narrative   Lives with wife and has a 37 year old.    Social Determinants of Health   Financial Resource Strain: Not on file  Food Insecurity: Not on file  Transportation Needs: Not on file  Physical Activity: Not on file  Stress: Not on file  Social Connections: Not on file    Tobacco Counseling Counseling given: Not Answered   How often do you need to have someone help you when you read instructions, pamphlets, or other written materials from your doctor or pharmacy?: (P) 1 - Never  Diabetic?no    Activities of Daily Living    03/01/2022    8:51 AM 02/28/2022   12:03 AM  In your present state of health, do you have any difficulty performing the following activities:  Hearing? 0 0  Vision? 0 0  Difficulty concentrating or making decisions? 0 1  Walking or climbing stairs? 0 0  Dressing or bathing? 0 0  Doing errands, shopping? 0 0  Preparing Food and eating ?  N  Using the Toilet?  N  In the past six months, have you accidently leaked urine?  Y  Do you have problems with loss of bowel control?  N  Managing your Medications?  N  Managing your Finances?  N  Housekeeping or managing your Housekeeping?  N    Patient Care Team: Lorrene Reid, PA-C as PCP - General (Physician Assistant) Minus Breeding, MD as PCP - Cardiology (Cardiology) Minus Breeding, MD as Consulting Physician (Cardiology) Michael Boston, MD as Consulting  Physician (General Surgery) Armbruster, Carlota Raspberry, MD as Consulting Physician (Gastroenterology) Remi Haggard, MD as Consulting Physician (Urology)  Indicate any recent Medical Services you may have received from other than [facility] providers in the past year (date may be approximate).     Assessment:   This is a routine wellness examination for [facility].  Hearing/Vision screen  No results found.  Dietary issues and exercise activities discussed: -Reports recovering from TURP surgery and currently on antibiotic therapy for infection so has not followed his usual exercise routine. Appetite has improved.   Goals Addressed   None   Depression Screen    03/01/2022    8:50 AM 12/12/2021    9:11 AM 08/29/2021    8:13 AM 06/06/2021    8:18 AM 03/14/2021   10:11 AM 03/02/2021    8:47 AM 11/29/2020    1:21 PM  PHQ 2/9 Scores  PHQ - 2 Score 2 2 0 2 0 1 1  PHQ- 9 Score 4 10 0 4 0 2 3    Fall Risk    03/01/2022    8:50 AM 02/28/2022   12:03 AM 12/12/2021    9:11 AM 08/29/2021    8:13 AM 06/06/2021    8:18 AM  Fall Risk   Falls in the past year? 0 1 1 0 1  Number falls in past yr: 0 0 0 0 0  Injury with Fall? 0 0 0 0 0  Risk for fall due to : No Fall Risks  No Fall Risks No Fall Risks No Fall Risks  Follow up Falls evaluation completed  Falls evaluation completed Falls evaluation completed Falls evaluation completed    FALL RISK PREVENTION PERTAINING TO THE HOME:  Any stairs in or around the home? No  If so, are there any without handrails? No  Home free of loose throw rugs in walkways, pet beds, electrical cords, etc? No  Adequate lighting in your home to reduce risk of falls? Yes   ASSISTIVE DEVICES UTILIZED TO PREVENT FALLS:  Life alert? No  Use of a cane, walker or w/c? No  Grab bars in the bathroom? No  Shower chair or bench in shower? No  Elevated toilet seat or a handicapped toilet? No   TIMED UP AND GO:  Was the test performed? Yes .  Length of time to ambulate 10 feet:  10 sec.   Gait steady and fast with assistive device  Cognitive Function: wnl's      03/01/2022    8:48 AM 03/02/2021    8:30 AM 02/25/2020    8:51 AM  6CIT Screen  What Year? 0 points 0 points 0 points  What month? 0 points 0 points 0 points  What time? 0 points 0 points 0 points  Count back from 20 0 points 0 points 0 points  Months in reverse 0 points 0 points 0 points  Repeat phrase 0 points 8 points 4 points  Total Score 0 points 8 points 4 points    Immunizations Immunization History  Administered Date(s) Administered   Influenza,inj,Quad PF,6+ Mos 02/25/2019, 02/25/2020, 03/02/2021, 03/01/2022   Tdap 02/25/2019    TDAP status: Up to date  Flu Vaccine status: Up to date  Pneumococcal vaccine status: Due, Education has been provided regarding the importance of this vaccine. Advised may receive this vaccine at local pharmacy or Health Dept. Aware to provide a copy of the vaccination record if obtained from local pharmacy or Health Dept. Verbalized acceptance and understanding.  Covid-19 vaccine status: Completed vaccines  Qualifies for Shingles Vaccine? Yes   Zostavax completed No   Shingrix Completed?: No.    Education has been provided regarding the importance of this vaccine. Patient has been advised to call insurance company to determine out of pocket expense if they have not yet received this vaccine. Advised may also receive vaccine at  local pharmacy or Health Dept. Verbalized acceptance and understanding.  Screening Tests Health Maintenance  Topic Date Due   COVID-19 Vaccine (1) Never done   HIV Screening  Never done   Hepatitis C Screening  Never done   Zoster Vaccines- Shingrix (1 of 2) Never done   COLONOSCOPY (Pts 45-64yr Insurance coverage will need to be confirmed)  06/10/2027   TETANUS/TDAP  02/24/2029   INFLUENZA VACCINE  Completed   HPV VACCINES  Aged Out    Health Maintenance  Health Maintenance Due  Topic Date Due   COVID-19 Vaccine (1)  Never done   HIV Screening  Never done   Hepatitis C Screening  Never done   Zoster Vaccines- Shingrix (1 of 2) Never done    Colorectal cancer screening: Type of screening: Colonoscopy. Completed 06/09/2020. Repeat every 7 years  Lung Cancer Screening: (Low Dose CT Chest recommended if Age 50-80years, 30 pack-year currently smoking OR have quit w/in 15years.) does not qualify.   Lung Cancer Screening Referral: n/a  Additional Screening:  Hepatitis C Screening: does qualify; Completed deferred  Vision Screening: Recommended annual ophthalmology exams for early detection of glaucoma and other disorders of the eye. Is the patient up to date with their annual eye exam?  Yes  Who is the provider or what is the name of the office in which the patient attends annual eye exams? Can't remember name of facility If pt is not established with a provider, would they like to be referred to a provider to establish care? No .   Dental Screening: Recommended annual dental exams for proper oral hygiene  Community Resource Referral / Chronic Care Management: CRR required this visit?  No   CCM required this visit?  No      Plan:  -Will obtain routine fasting labs. -Pt agreeable to flu vaccine. -Recommend to follow-up with urology as scheduled and continue ATB therapy. -[facility] -Follow-up in 6 months for reg OV- mood, HLD.  I have personally reviewed and noted the following in the patient's chart:   Medical and social history Use of alcohol, tobacco or illicit drugs  Current medications and supplements including opioid prescriptions. Patient is not currently taking opioid prescriptions. Functional ability and status Nutritional status Physical activity Advanced directives List of other physicians Hospitalizations, surgeries, and ER visits in previous 12 months Vitals Screenings to include cognitive, depression, and falls Referrals and appointments  In addition,  I have reviewed and discussed with patient certain preventive protocols, quality metrics, and best practice recommendations. A written personalized care plan for preventive services as well as general preventive health recommendations were provided to patient.     MLorrene Reid PA-C   03/01/2022

## 2022-03-01 NOTE — Patient Instructions (Signed)

## 2022-03-02 LAB — CBC WITH DIFFERENTIAL/PLATELET
Basophils Absolute: 0 10*3/uL (ref 0.0–0.2)
Basos: 1 %
EOS (ABSOLUTE): 0.4 10*3/uL (ref 0.0–0.4)
Eos: 7 %
Hematocrit: 38.9 % (ref 37.5–51.0)
Hemoglobin: 13.1 g/dL (ref 13.0–17.7)
Immature Grans (Abs): 0 10*3/uL (ref 0.0–0.1)
Immature Granulocytes: 0 %
Lymphocytes Absolute: 2.1 10*3/uL (ref 0.7–3.1)
Lymphs: 37 %
MCH: 30 pg (ref 26.6–33.0)
MCHC: 33.7 g/dL (ref 31.5–35.7)
MCV: 89 fL (ref 79–97)
Monocytes Absolute: 0.5 10*3/uL (ref 0.1–0.9)
Monocytes: 8 %
Neutrophils Absolute: 2.7 10*3/uL (ref 1.4–7.0)
Neutrophils: 47 %
Platelets: 192 10*3/uL (ref 150–450)
RBC: 4.36 x10E6/uL (ref 4.14–5.80)
RDW: 12.7 % (ref 11.6–15.4)
WBC: 5.8 10*3/uL (ref 3.4–10.8)

## 2022-03-02 LAB — LIPID PANEL
Chol/HDL Ratio: 2.5 ratio (ref 0.0–5.0)
Cholesterol, Total: 144 mg/dL (ref 100–199)
HDL: 57 mg/dL (ref >39)
LDL Chol Calc (NIH): 79 mg/dL (ref 0–99)
Triglycerides: 33 mg/dL (ref 0–149)
VLDL Cholesterol Cal: 8 mg/dL (ref 5–40)

## 2022-03-02 LAB — COMPREHENSIVE METABOLIC PANEL
ALT: 24 IU/L (ref 0–44)
AST: 21 IU/L (ref 0–40)
Albumin/Globulin Ratio: 2 (ref 1.2–2.2)
Albumin: 4.7 g/dL (ref 4.1–5.1)
Alkaline Phosphatase: 53 IU/L (ref 44–121)
BUN/Creatinine Ratio: 10 (ref 9–20)
BUN: 10 mg/dL (ref 6–24)
Bilirubin Total: 0.3 mg/dL (ref 0.0–1.2)
CO2: 23 mmol/L (ref 20–29)
Calcium: 9.7 mg/dL (ref 8.7–10.2)
Chloride: 103 mmol/L (ref 96–106)
Creatinine, Ser: 1 mg/dL (ref 0.76–1.27)
Globulin, Total: 2.4 g/dL (ref 1.5–4.5)
Glucose: 83 mg/dL (ref 70–99)
Potassium: 4.5 mmol/L (ref 3.5–5.2)
Sodium: 143 mmol/L (ref 134–144)
Total Protein: 7.1 g/dL (ref 6.0–8.5)
eGFR: 92 mL/min/{1.73_m2} (ref >59)

## 2022-03-02 LAB — HEMOGLOBIN A1C
Est. average glucose Bld gHb Est-mCnc: 103 mg/dL
Hgb A1c MFr Bld: 5.2 % (ref 4.8–5.6)

## 2022-03-02 LAB — TSH: TSH: 1.51 u[IU]/mL (ref 0.450–4.500)

## 2022-03-06 DIAGNOSIS — R69 Illness, unspecified: Secondary | ICD-10-CM | POA: Diagnosis not present

## 2022-03-06 DIAGNOSIS — F411 Generalized anxiety disorder: Secondary | ICD-10-CM | POA: Diagnosis not present

## 2022-03-06 DIAGNOSIS — F332 Major depressive disorder, recurrent severe without psychotic features: Secondary | ICD-10-CM | POA: Diagnosis not present

## 2022-03-12 DIAGNOSIS — D225 Melanocytic nevi of trunk: Secondary | ICD-10-CM | POA: Diagnosis not present

## 2022-03-12 DIAGNOSIS — L988 Other specified disorders of the skin and subcutaneous tissue: Secondary | ICD-10-CM | POA: Diagnosis not present

## 2022-03-12 DIAGNOSIS — D485 Neoplasm of uncertain behavior of skin: Secondary | ICD-10-CM | POA: Diagnosis not present

## 2022-03-13 DIAGNOSIS — R69 Illness, unspecified: Secondary | ICD-10-CM | POA: Diagnosis not present

## 2022-03-13 DIAGNOSIS — F332 Major depressive disorder, recurrent severe without psychotic features: Secondary | ICD-10-CM | POA: Diagnosis not present

## 2022-03-13 DIAGNOSIS — F411 Generalized anxiety disorder: Secondary | ICD-10-CM | POA: Diagnosis not present

## 2022-03-15 DIAGNOSIS — R3914 Feeling of incomplete bladder emptying: Secondary | ICD-10-CM | POA: Diagnosis not present

## 2022-03-20 DIAGNOSIS — F332 Major depressive disorder, recurrent severe without psychotic features: Secondary | ICD-10-CM | POA: Diagnosis not present

## 2022-03-20 DIAGNOSIS — R69 Illness, unspecified: Secondary | ICD-10-CM | POA: Diagnosis not present

## 2022-03-20 DIAGNOSIS — F411 Generalized anxiety disorder: Secondary | ICD-10-CM | POA: Diagnosis not present

## 2022-03-27 DIAGNOSIS — R69 Illness, unspecified: Secondary | ICD-10-CM | POA: Diagnosis not present

## 2022-03-27 DIAGNOSIS — F332 Major depressive disorder, recurrent severe without psychotic features: Secondary | ICD-10-CM | POA: Diagnosis not present

## 2022-03-27 DIAGNOSIS — F411 Generalized anxiety disorder: Secondary | ICD-10-CM | POA: Diagnosis not present

## 2022-04-03 DIAGNOSIS — F332 Major depressive disorder, recurrent severe without psychotic features: Secondary | ICD-10-CM | POA: Diagnosis not present

## 2022-04-03 DIAGNOSIS — R69 Illness, unspecified: Secondary | ICD-10-CM | POA: Diagnosis not present

## 2022-04-03 DIAGNOSIS — F411 Generalized anxiety disorder: Secondary | ICD-10-CM | POA: Diagnosis not present

## 2022-04-10 DIAGNOSIS — F411 Generalized anxiety disorder: Secondary | ICD-10-CM | POA: Diagnosis not present

## 2022-04-10 DIAGNOSIS — R69 Illness, unspecified: Secondary | ICD-10-CM | POA: Diagnosis not present

## 2022-04-10 DIAGNOSIS — F332 Major depressive disorder, recurrent severe without psychotic features: Secondary | ICD-10-CM | POA: Diagnosis not present

## 2022-04-23 DIAGNOSIS — M542 Cervicalgia: Secondary | ICD-10-CM | POA: Diagnosis not present

## 2022-04-23 DIAGNOSIS — M7581 Other shoulder lesions, right shoulder: Secondary | ICD-10-CM | POA: Diagnosis not present

## 2022-04-24 DIAGNOSIS — F332 Major depressive disorder, recurrent severe without psychotic features: Secondary | ICD-10-CM | POA: Diagnosis not present

## 2022-04-24 DIAGNOSIS — R69 Illness, unspecified: Secondary | ICD-10-CM | POA: Diagnosis not present

## 2022-04-24 DIAGNOSIS — F411 Generalized anxiety disorder: Secondary | ICD-10-CM | POA: Diagnosis not present

## 2022-04-25 ENCOUNTER — Ambulatory Visit: Payer: Medicare HMO | Admitting: Physician Assistant

## 2022-04-29 DIAGNOSIS — M542 Cervicalgia: Secondary | ICD-10-CM | POA: Diagnosis not present

## 2022-05-01 DIAGNOSIS — F411 Generalized anxiety disorder: Secondary | ICD-10-CM | POA: Diagnosis not present

## 2022-05-01 DIAGNOSIS — R69 Illness, unspecified: Secondary | ICD-10-CM | POA: Diagnosis not present

## 2022-05-01 DIAGNOSIS — F332 Major depressive disorder, recurrent severe without psychotic features: Secondary | ICD-10-CM | POA: Diagnosis not present

## 2022-05-08 DIAGNOSIS — R69 Illness, unspecified: Secondary | ICD-10-CM | POA: Diagnosis not present

## 2022-05-08 DIAGNOSIS — F411 Generalized anxiety disorder: Secondary | ICD-10-CM | POA: Diagnosis not present

## 2022-05-08 DIAGNOSIS — F332 Major depressive disorder, recurrent severe without psychotic features: Secondary | ICD-10-CM | POA: Diagnosis not present

## 2022-05-09 DIAGNOSIS — M7581 Other shoulder lesions, right shoulder: Secondary | ICD-10-CM | POA: Diagnosis not present

## 2022-05-15 DIAGNOSIS — R69 Illness, unspecified: Secondary | ICD-10-CM | POA: Diagnosis not present

## 2022-05-15 DIAGNOSIS — F330 Major depressive disorder, recurrent, mild: Secondary | ICD-10-CM | POA: Diagnosis not present

## 2022-05-15 DIAGNOSIS — F410 Panic disorder [episodic paroxysmal anxiety] without agoraphobia: Secondary | ICD-10-CM | POA: Diagnosis not present

## 2022-05-21 DIAGNOSIS — S46011D Strain of muscle(s) and tendon(s) of the rotator cuff of right shoulder, subsequent encounter: Secondary | ICD-10-CM | POA: Diagnosis not present

## 2022-05-28 DIAGNOSIS — F411 Generalized anxiety disorder: Secondary | ICD-10-CM | POA: Diagnosis not present

## 2022-05-28 DIAGNOSIS — R69 Illness, unspecified: Secondary | ICD-10-CM | POA: Diagnosis not present

## 2022-05-28 DIAGNOSIS — F332 Major depressive disorder, recurrent severe without psychotic features: Secondary | ICD-10-CM | POA: Diagnosis not present

## 2022-06-05 DIAGNOSIS — R69 Illness, unspecified: Secondary | ICD-10-CM | POA: Diagnosis not present

## 2022-06-05 DIAGNOSIS — F411 Generalized anxiety disorder: Secondary | ICD-10-CM | POA: Diagnosis not present

## 2022-06-05 DIAGNOSIS — F332 Major depressive disorder, recurrent severe without psychotic features: Secondary | ICD-10-CM | POA: Diagnosis not present

## 2022-06-06 DIAGNOSIS — D225 Melanocytic nevi of trunk: Secondary | ICD-10-CM | POA: Diagnosis not present

## 2022-06-06 DIAGNOSIS — D485 Neoplasm of uncertain behavior of skin: Secondary | ICD-10-CM | POA: Diagnosis not present

## 2022-06-07 DIAGNOSIS — R3914 Feeling of incomplete bladder emptying: Secondary | ICD-10-CM | POA: Diagnosis not present

## 2022-06-07 DIAGNOSIS — R350 Frequency of micturition: Secondary | ICD-10-CM | POA: Diagnosis not present

## 2022-06-07 DIAGNOSIS — N401 Enlarged prostate with lower urinary tract symptoms: Secondary | ICD-10-CM | POA: Diagnosis not present

## 2022-06-12 DIAGNOSIS — F411 Generalized anxiety disorder: Secondary | ICD-10-CM | POA: Diagnosis not present

## 2022-06-12 DIAGNOSIS — F332 Major depressive disorder, recurrent severe without psychotic features: Secondary | ICD-10-CM | POA: Diagnosis not present

## 2022-06-12 DIAGNOSIS — R69 Illness, unspecified: Secondary | ICD-10-CM | POA: Diagnosis not present

## 2022-06-14 DIAGNOSIS — N35819 Other urethral stricture, male, unspecified site: Secondary | ICD-10-CM | POA: Diagnosis not present

## 2022-06-14 DIAGNOSIS — R3914 Feeling of incomplete bladder emptying: Secondary | ICD-10-CM | POA: Diagnosis not present

## 2022-06-19 DIAGNOSIS — F332 Major depressive disorder, recurrent severe without psychotic features: Secondary | ICD-10-CM | POA: Diagnosis not present

## 2022-06-19 DIAGNOSIS — R69 Illness, unspecified: Secondary | ICD-10-CM | POA: Diagnosis not present

## 2022-06-19 DIAGNOSIS — F411 Generalized anxiety disorder: Secondary | ICD-10-CM | POA: Diagnosis not present

## 2022-06-26 DIAGNOSIS — R69 Illness, unspecified: Secondary | ICD-10-CM | POA: Diagnosis not present

## 2022-06-26 DIAGNOSIS — F411 Generalized anxiety disorder: Secondary | ICD-10-CM | POA: Diagnosis not present

## 2022-06-26 DIAGNOSIS — F332 Major depressive disorder, recurrent severe without psychotic features: Secondary | ICD-10-CM | POA: Diagnosis not present

## 2022-06-27 DIAGNOSIS — D692 Other nonthrombocytopenic purpura: Secondary | ICD-10-CM | POA: Diagnosis not present

## 2022-06-27 DIAGNOSIS — D2371 Other benign neoplasm of skin of right lower limb, including hip: Secondary | ICD-10-CM | POA: Diagnosis not present

## 2022-07-03 DIAGNOSIS — F411 Generalized anxiety disorder: Secondary | ICD-10-CM | POA: Diagnosis not present

## 2022-07-03 DIAGNOSIS — F332 Major depressive disorder, recurrent severe without psychotic features: Secondary | ICD-10-CM | POA: Diagnosis not present

## 2022-07-03 DIAGNOSIS — R69 Illness, unspecified: Secondary | ICD-10-CM | POA: Diagnosis not present

## 2022-07-05 DIAGNOSIS — N35819 Other urethral stricture, male, unspecified site: Secondary | ICD-10-CM | POA: Diagnosis not present

## 2022-07-05 DIAGNOSIS — R3914 Feeling of incomplete bladder emptying: Secondary | ICD-10-CM | POA: Diagnosis not present

## 2022-07-10 DIAGNOSIS — Z87891 Personal history of nicotine dependence: Secondary | ICD-10-CM | POA: Diagnosis not present

## 2022-07-10 DIAGNOSIS — Z8249 Family history of ischemic heart disease and other diseases of the circulatory system: Secondary | ICD-10-CM | POA: Diagnosis not present

## 2022-07-10 DIAGNOSIS — G4700 Insomnia, unspecified: Secondary | ICD-10-CM | POA: Diagnosis not present

## 2022-07-10 DIAGNOSIS — F411 Generalized anxiety disorder: Secondary | ICD-10-CM | POA: Diagnosis not present

## 2022-07-10 DIAGNOSIS — Z85828 Personal history of other malignant neoplasm of skin: Secondary | ICD-10-CM | POA: Diagnosis not present

## 2022-07-10 DIAGNOSIS — F3341 Major depressive disorder, recurrent, in partial remission: Secondary | ICD-10-CM | POA: Diagnosis not present

## 2022-07-10 DIAGNOSIS — R69 Illness, unspecified: Secondary | ICD-10-CM | POA: Diagnosis not present

## 2022-07-10 DIAGNOSIS — N400 Enlarged prostate without lower urinary tract symptoms: Secondary | ICD-10-CM | POA: Diagnosis not present

## 2022-07-10 DIAGNOSIS — Z9181 History of falling: Secondary | ICD-10-CM | POA: Diagnosis not present

## 2022-07-10 DIAGNOSIS — Z833 Family history of diabetes mellitus: Secondary | ICD-10-CM | POA: Diagnosis not present

## 2022-07-14 DIAGNOSIS — M545 Low back pain, unspecified: Secondary | ICD-10-CM | POA: Diagnosis not present

## 2022-07-17 ENCOUNTER — Ambulatory Visit (INDEPENDENT_AMBULATORY_CARE_PROVIDER_SITE_OTHER): Payer: Medicare HMO | Admitting: Family Medicine

## 2022-07-17 ENCOUNTER — Encounter (INDEPENDENT_AMBULATORY_CARE_PROVIDER_SITE_OTHER): Payer: Medicare HMO | Admitting: Family Medicine

## 2022-07-17 ENCOUNTER — Encounter: Payer: Self-pay | Admitting: Family Medicine

## 2022-07-17 ENCOUNTER — Ambulatory Visit
Admission: RE | Admit: 2022-07-17 | Discharge: 2022-07-17 | Disposition: A | Discharge status: Home / Self Care | Payer: Medicare HMO | Source: Ambulatory Visit | Attending: Family Medicine | Admitting: Family Medicine

## 2022-07-17 VITALS — BP 116/74 | HR 73 | Resp 18 | Ht 68.5 in | Wt 168.0 lb

## 2022-07-17 DIAGNOSIS — R109 Unspecified abdominal pain: Secondary | ICD-10-CM

## 2022-07-17 DIAGNOSIS — N281 Cyst of kidney, acquired: Secondary | ICD-10-CM | POA: Diagnosis not present

## 2022-07-17 DIAGNOSIS — F331 Major depressive disorder, recurrent, moderate: Secondary | ICD-10-CM | POA: Diagnosis not present

## 2022-07-17 DIAGNOSIS — R69 Illness, unspecified: Secondary | ICD-10-CM | POA: Diagnosis not present

## 2022-07-17 DIAGNOSIS — M549 Dorsalgia, unspecified: Secondary | ICD-10-CM | POA: Diagnosis not present

## 2022-07-17 DIAGNOSIS — F411 Generalized anxiety disorder: Secondary | ICD-10-CM | POA: Diagnosis not present

## 2022-07-17 LAB — POCT URINALYSIS DIPSTICK
Bilirubin, UA: NEGATIVE
Blood, UA: NEGATIVE
Glucose, UA: NEGATIVE
Ketones, UA: NEGATIVE
Leukocytes, UA: NEGATIVE
Nitrite, UA: NEGATIVE
Protein, UA: NEGATIVE
Spec Grav, UA: <=1.005 — AB (ref 1.010–1.025)
Urobilinogen, UA: 0.2 E.U./dL
pH, UA: 6 (ref 5.0–8.0)

## 2022-07-17 NOTE — Progress Notes (Signed)
  Acute Office Visit  Subjective:     Patient ID: Eric Hopkins, male    DOB: 08-28-1970, 52 y.o.   MRN: [id number]  Chief Complaint  Patient presents with   Back Pain    Urinary Tract Infection: Patient complains of constant, dull bilateral flank pain occasionally radiating with shooting pain to the groin.  He has had symptoms for 2 months. Patient also complains of occasional chills.  He does have urinary frequency but this is something that is not new for him, he had prostate surgery September 2023 and does follow-up with urology. Patient denies congestion, cough, fever, headache, sorethroat, new/changing penile discharge, constipation, diarrhea and stomach ache. Patient does have a history of somewhat recent UTI after prostate surgery in September, after 2 rounds of antibiotics though symptoms did completely resolve.  Patient does not have a history of pyelonephritis.  He has taken ibuprofen and Tylenol with minimal relief.  He saw urology 2 weeks ago with a clear UA, saw urgent care 3 days ago with a clear UA.  ROS Negative unless otherwise noted in HPI    Objective:    BP 116/74 (BP Location: Left Arm, Patient Position: Sitting, Cuff Size: Normal)   Pulse 73   Resp 18   Ht 5' 8.5" (1.74 m)   Wt 168 lb (76.2 kg)   SpO2 99%   BMI 25.17 kg/m   Physical Exam Constitutional:      General: He is not in acute distress.    Appearance: Normal appearance. He is not ill-appearing.  HENT:     Head: Normocephalic and atraumatic.  Abdominal:     General: Abdomen is flat. Bowel sounds are normal. There is no distension.     Palpations: Abdomen is soft. There is no mass.     Tenderness: There is no abdominal tenderness. There is no right CVA tenderness or left CVA tenderness.  Musculoskeletal:     Thoracic back: No spasms, tenderness or bony tenderness.     Lumbar back: No spasms, tenderness or bony tenderness.  Neurological:     Mental Status: He is alert and oriented to person,  place, and time.  Psychiatric:        Mood and Affect: Mood normal.    Results for orders placed or performed in visit on 07/17/22  POCT Urinalysis Dipstick  Result Value Ref Range   Color, UA Yellow    Clarity, UA Clear    Glucose, UA Negative Negative   Bilirubin, UA Negative    Ketones, UA Negative    Spec Grav, UA <=1.005 (A) 1.010 - 1.025   Blood, UA Negative    pH, UA 6.0 5.0 - 8.0   Protein, UA Negative Negative   Urobilinogen, UA 0.2 0.2 or 1.0 E.U./dL   Nitrite, UA Negative    Leukocytes, UA Negative Negative   Appearance     Odor        Assessment & Plan:  Bilateral flank pain -     US RENAL; Future  Flank pain -     POCT urinalysis dipstick  With another UA that does not indicate UTI and no concern for any STIs, we will start with bilateral renal ultrasound to look for any structural abnormalities, kidney stones, hydronephrosis.  Return in about 1 week (around 07/24/2022) for follow-up.  Velva Harman, PA

## 2022-07-19 ENCOUNTER — Encounter: Payer: Self-pay | Admitting: Family Medicine

## 2022-07-19 ENCOUNTER — Telehealth: Payer: Self-pay | Admitting: Gastroenterology

## 2022-07-19 ENCOUNTER — Ambulatory Visit
Admission: RE | Admit: 2022-07-19 | Discharge: 2022-07-19 | Disposition: A | Discharge status: Home / Self Care | Payer: Medicare HMO | Source: Ambulatory Visit | Attending: Family Medicine | Admitting: Family Medicine

## 2022-07-19 DIAGNOSIS — R109 Unspecified abdominal pain: Secondary | ICD-10-CM

## 2022-07-19 NOTE — Telephone Encounter (Signed)
Patient is calling states that he had ultrasounds done on his kidneys and both his doctors are concerned with the amount of stool in his colon. Patient is wondering what he should do. Please advise

## 2022-07-19 NOTE — Telephone Encounter (Signed)
Returned call to patient. I informed patient that I reviewed his CT scan from today - moderate-large stool burden. Pt states that he is not having any pain or discomfort. He states that he does not feel constipated. Pt states that he eats a high fiber diet (mediterranean diet), I informed him that fiber does add bulk to the stool so he may want to cut back on that. I informed patient that Virgil Benedict, PA recommended that he try 3 doses of Miralax for a few days and then titrate down. I advised patient that he should follow those recommendations because we typically recommend the same thing for constipation. Pt has been advised to make sure he is drinking plenty of water as well. Pt verbalized understanding and had no concerns at the end of the call.

## 2022-07-22 DIAGNOSIS — R3914 Feeling of incomplete bladder emptying: Secondary | ICD-10-CM | POA: Diagnosis not present

## 2022-07-22 NOTE — Progress Notes (Unsigned)
   Established Patient Office Visit  Subjective   Patient ID: Eric Hopkins, male    DOB: 1971-05-13  Age: 52 y.o. MRN: [id number]  No chief complaint on file.   HPI Eric Hopkins is a 52 y.o. male presenting today for follow up of bilateral flank pain.  Renal ultrasound last week negative for structural abnormalities, kidney stones, hydronephrosis.  CT also negative other than moderate-large amount of stool in the colon.  Recommended MiraLAX 3 times a day for few days before titrating down.  Saw urology on Monday. Called gastroenterologist on 2/23, they also recommended MiraLAX.  ROS Negative unless otherwise noted in HPI   Objective:     There were no vitals taken for this visit.  Physical Exam   No results found for any visits on 07/24/22.  {Labs (Optional):23779}  The 10-year ASCVD risk score (Arnett DK, et al., 2019) is: 1.6%    Assessment & Plan:  There are no diagnoses linked to this encounter.  No follow-ups on file.    Velva Harman, PA

## 2022-07-24 ENCOUNTER — Encounter: Payer: Self-pay | Admitting: Family Medicine

## 2022-07-24 ENCOUNTER — Ambulatory Visit (INDEPENDENT_AMBULATORY_CARE_PROVIDER_SITE_OTHER): Payer: Medicare HMO | Admitting: Family Medicine

## 2022-07-24 VITALS — BP 100/65 | HR 80 | Ht 68.5 in | Wt 168.0 lb

## 2022-07-24 DIAGNOSIS — R109 Unspecified abdominal pain: Secondary | ICD-10-CM

## 2022-07-24 DIAGNOSIS — F332 Major depressive disorder, recurrent severe without psychotic features: Secondary | ICD-10-CM | POA: Diagnosis not present

## 2022-07-24 DIAGNOSIS — F32A Depression, unspecified: Secondary | ICD-10-CM | POA: Diagnosis not present

## 2022-07-24 DIAGNOSIS — F419 Anxiety disorder, unspecified: Secondary | ICD-10-CM | POA: Diagnosis not present

## 2022-07-24 DIAGNOSIS — F411 Generalized anxiety disorder: Secondary | ICD-10-CM | POA: Diagnosis not present

## 2022-07-24 DIAGNOSIS — R69 Illness, unspecified: Secondary | ICD-10-CM | POA: Diagnosis not present

## 2022-07-24 NOTE — Patient Instructions (Signed)
Increase Wellbutrin dose to 300 mg.  Take MiraLAX 3 times a day for 1 day.  After that, take it once a day for 1 to 2 weeks.  Call your urologist if you have any more blood in your urine, or any difficulty or burning with urination.

## 2022-07-24 NOTE — Assessment & Plan Note (Signed)
Encouraged him to continue with therapy.  We discussed that given the extra stressors in his life it would be reasonable to increase the medication that he is on if he feels that his current regimen with therapy is not quite enough and his mood is lower.  PHQ-9 score was 6 today, up from 3 last week.  He agreed that this might be beneficial, and will increase Wellbutrin from 150 mg to 300 mg as he remember that his psychiatrist also mentioned that that would be an option.  Will continue to monitor.

## 2022-07-25 ENCOUNTER — Ambulatory Visit: Payer: Medicare HMO | Admitting: Family Medicine

## 2022-07-31 DIAGNOSIS — F411 Generalized anxiety disorder: Secondary | ICD-10-CM | POA: Diagnosis not present

## 2022-07-31 DIAGNOSIS — F332 Major depressive disorder, recurrent severe without psychotic features: Secondary | ICD-10-CM | POA: Diagnosis not present

## 2022-07-31 DIAGNOSIS — N401 Enlarged prostate with lower urinary tract symptoms: Secondary | ICD-10-CM | POA: Diagnosis not present

## 2022-07-31 DIAGNOSIS — R69 Illness, unspecified: Secondary | ICD-10-CM | POA: Diagnosis not present

## 2022-07-31 DIAGNOSIS — R3914 Feeling of incomplete bladder emptying: Secondary | ICD-10-CM | POA: Diagnosis not present

## 2022-08-07 DIAGNOSIS — F410 Panic disorder [episodic paroxysmal anxiety] without agoraphobia: Secondary | ICD-10-CM | POA: Diagnosis not present

## 2022-08-07 DIAGNOSIS — F330 Major depressive disorder, recurrent, mild: Secondary | ICD-10-CM | POA: Diagnosis not present

## 2022-08-07 DIAGNOSIS — F331 Major depressive disorder, recurrent, moderate: Secondary | ICD-10-CM | POA: Diagnosis not present

## 2022-08-07 DIAGNOSIS — R69 Illness, unspecified: Secondary | ICD-10-CM | POA: Diagnosis not present

## 2022-08-07 DIAGNOSIS — F332 Major depressive disorder, recurrent severe without psychotic features: Secondary | ICD-10-CM | POA: Diagnosis not present

## 2022-08-07 DIAGNOSIS — F411 Generalized anxiety disorder: Secondary | ICD-10-CM | POA: Diagnosis not present

## 2022-08-21 DIAGNOSIS — F411 Generalized anxiety disorder: Secondary | ICD-10-CM | POA: Diagnosis not present

## 2022-08-21 DIAGNOSIS — F332 Major depressive disorder, recurrent severe without psychotic features: Secondary | ICD-10-CM | POA: Diagnosis not present

## 2022-08-21 DIAGNOSIS — R69 Illness, unspecified: Secondary | ICD-10-CM | POA: Diagnosis not present

## 2022-08-28 DIAGNOSIS — F411 Generalized anxiety disorder: Secondary | ICD-10-CM | POA: Diagnosis not present

## 2022-08-28 DIAGNOSIS — R69 Illness, unspecified: Secondary | ICD-10-CM | POA: Diagnosis not present

## 2022-08-28 DIAGNOSIS — F332 Major depressive disorder, recurrent severe without psychotic features: Secondary | ICD-10-CM | POA: Diagnosis not present

## 2022-09-04 DIAGNOSIS — F332 Major depressive disorder, recurrent severe without psychotic features: Secondary | ICD-10-CM | POA: Diagnosis not present

## 2022-09-04 DIAGNOSIS — R69 Illness, unspecified: Secondary | ICD-10-CM | POA: Diagnosis not present

## 2022-09-04 DIAGNOSIS — F411 Generalized anxiety disorder: Secondary | ICD-10-CM | POA: Diagnosis not present

## 2022-09-06 ENCOUNTER — Ambulatory Visit: Payer: Medicare HMO | Admitting: Family Medicine

## 2022-09-11 DIAGNOSIS — R69 Illness, unspecified: Secondary | ICD-10-CM | POA: Diagnosis not present

## 2022-09-11 DIAGNOSIS — F411 Generalized anxiety disorder: Secondary | ICD-10-CM | POA: Diagnosis not present

## 2022-09-11 DIAGNOSIS — F332 Major depressive disorder, recurrent severe without psychotic features: Secondary | ICD-10-CM | POA: Diagnosis not present

## 2022-09-11 DIAGNOSIS — N401 Enlarged prostate with lower urinary tract symptoms: Secondary | ICD-10-CM | POA: Diagnosis not present

## 2022-09-11 DIAGNOSIS — R3914 Feeling of incomplete bladder emptying: Secondary | ICD-10-CM | POA: Diagnosis not present

## 2022-09-16 DIAGNOSIS — F330 Major depressive disorder, recurrent, mild: Secondary | ICD-10-CM | POA: Diagnosis not present

## 2022-09-16 DIAGNOSIS — F410 Panic disorder [episodic paroxysmal anxiety] without agoraphobia: Secondary | ICD-10-CM | POA: Diagnosis not present

## 2022-09-16 DIAGNOSIS — R69 Illness, unspecified: Secondary | ICD-10-CM | POA: Diagnosis not present

## 2022-09-18 DIAGNOSIS — F332 Major depressive disorder, recurrent severe without psychotic features: Secondary | ICD-10-CM | POA: Diagnosis not present

## 2022-09-18 DIAGNOSIS — F411 Generalized anxiety disorder: Secondary | ICD-10-CM | POA: Diagnosis not present

## 2022-09-19 ENCOUNTER — Ambulatory Visit: Payer: Medicare HMO | Admitting: Family Medicine

## 2022-09-27 IMAGING — CR DG CHEST 2V
2 series · 2 of 2 positions shown · non-contrast
Comparison: None.

CLINICAL DATA: Chest pain

EXAM:
CHEST - 2 VIEW

[w chest pa]
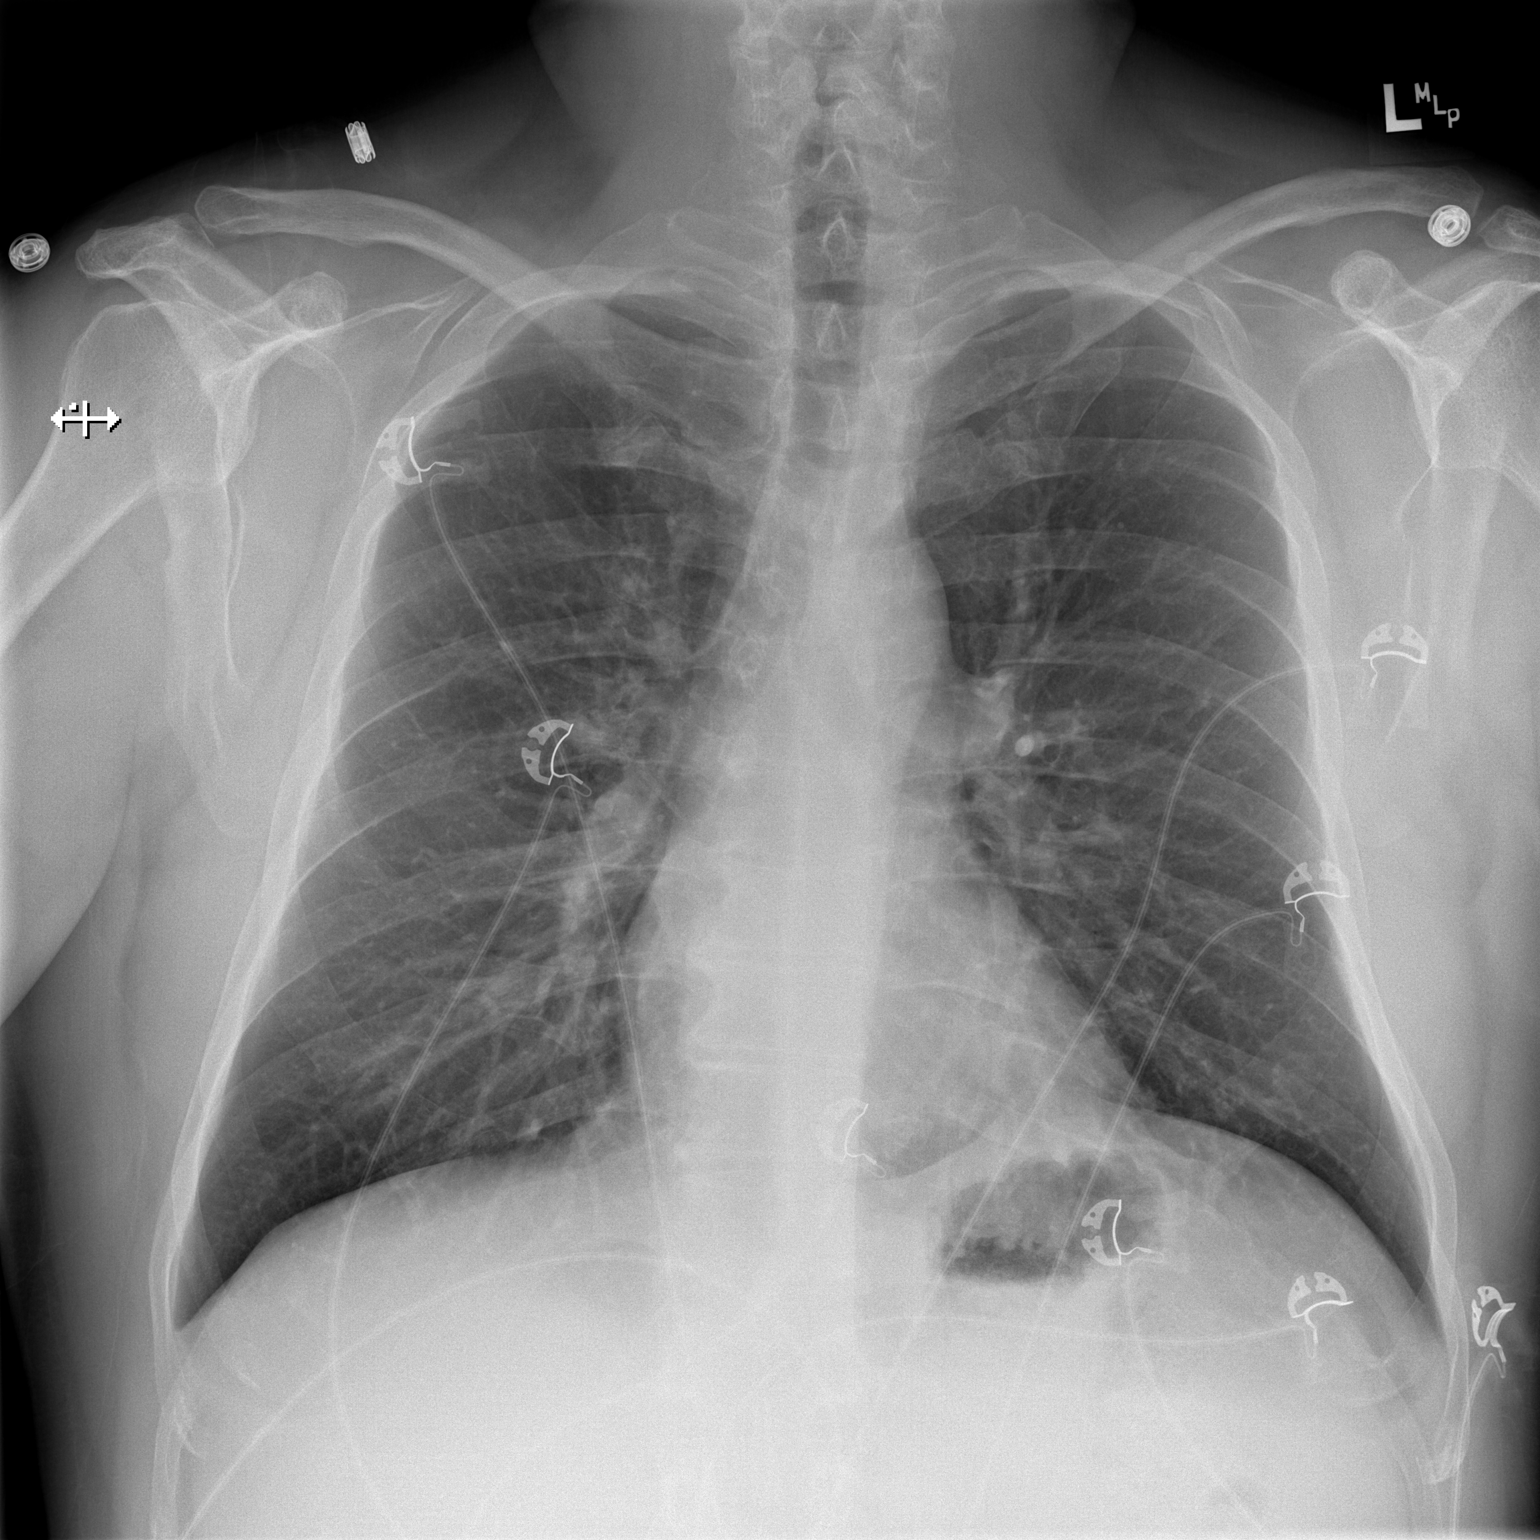

[w chest lat]
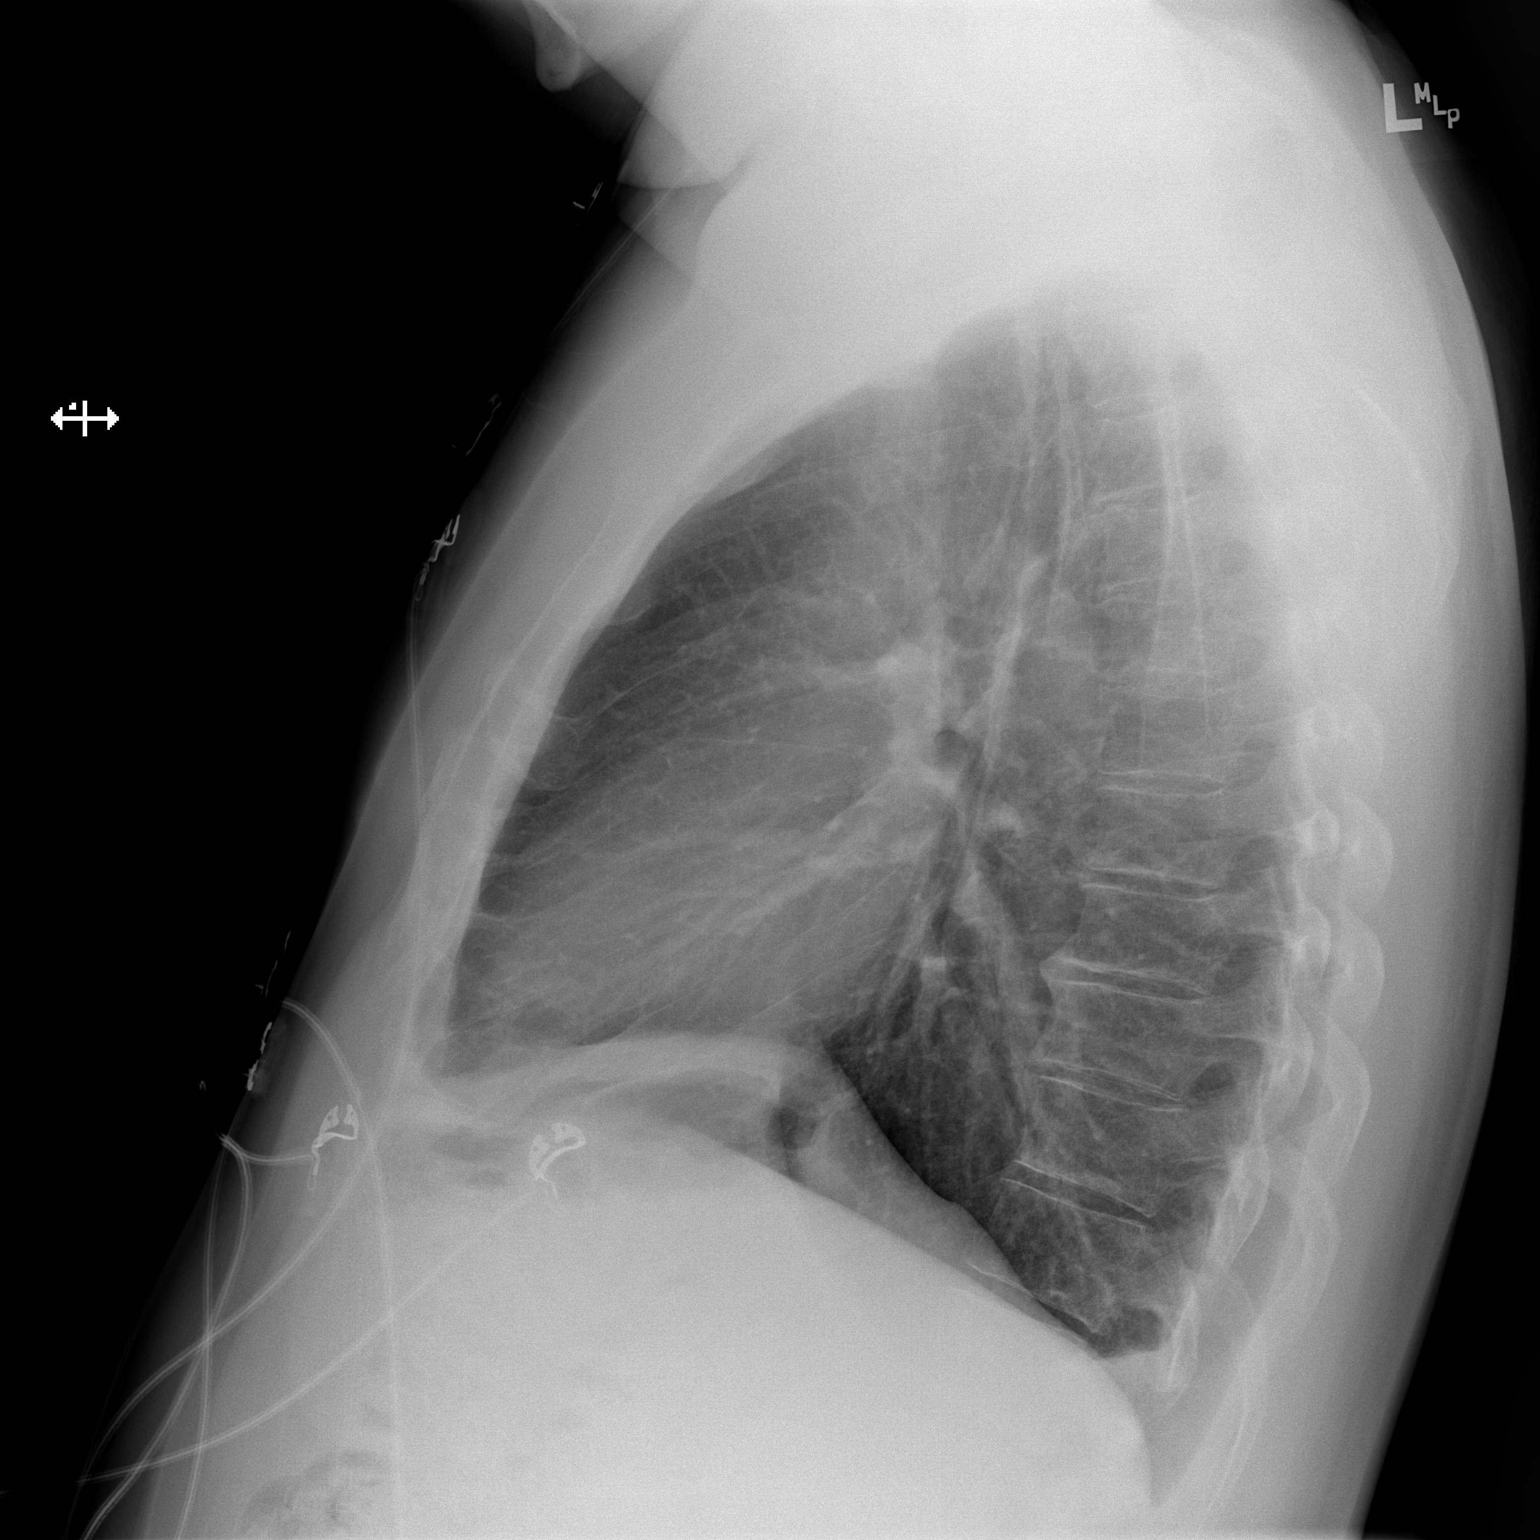

[2 of 2 positions shown; findings below may reference images not displayed]

FINDINGS: Lungs are clear. Heart size and pulmonary vascularity are normal. No
adenopathy. No pneumothorax. No bone lesions.
IMPRESSION: Lungs clear.  Cardiac silhouette normal.

## 2023-07-03 ENCOUNTER — Encounter: Payer: Self-pay | Admitting: Family Medicine
